# Patient Record
Sex: Female | Born: 1975 | Race: Black or African American | Hispanic: No | Marital: Single | Smoking: Never smoker
Health system: Southern US, Community
[De-identification: ages and names within clinical notes are randomized; demographics above are authoritative.]

## PROBLEM LIST (undated history)

## (undated) DIAGNOSIS — I10 Essential (primary) hypertension: Secondary | ICD-10-CM

## (undated) DIAGNOSIS — D649 Anemia, unspecified: Secondary | ICD-10-CM

## (undated) DIAGNOSIS — K8020 Calculus of gallbladder without cholecystitis without obstruction: Secondary | ICD-10-CM

## (undated) HISTORY — DX: Essential (primary) hypertension: I10

## (undated) HISTORY — DX: Anemia, unspecified: D64.9

## (undated) HISTORY — DX: Calculus of gallbladder without cholecystitis without obstruction: K80.20

## (undated) HISTORY — PX: CHOLECYSTECTOMY: SHX55

---

## 2001-11-17 DIAGNOSIS — I10 Essential (primary) hypertension: Secondary | ICD-10-CM | POA: Insufficient documentation

## 2009-01-27 ENCOUNTER — Emergency Department: Payer: Self-pay | Admitting: Unknown Physician Specialty

## 2009-05-06 ENCOUNTER — Emergency Department: Payer: Self-pay | Admitting: Emergency Medicine

## 2009-05-07 ENCOUNTER — Inpatient Hospital Stay: Payer: Self-pay | Admitting: Obstetrics & Gynecology

## 2009-11-09 HISTORY — PX: ABDOMINAL HYSTERECTOMY: SHX81

## 2010-02-12 ENCOUNTER — Ambulatory Visit: Payer: Self-pay | Admitting: Obstetrics and Gynecology

## 2010-02-20 ENCOUNTER — Ambulatory Visit: Payer: Self-pay | Admitting: Obstetrics and Gynecology

## 2014-01-11 ENCOUNTER — Telehealth (HOSPITAL_COMMUNITY): Payer: Self-pay | Admitting: *Deleted

## 2014-06-01 DIAGNOSIS — R9431 Abnormal electrocardiogram [ECG] [EKG]: Secondary | ICD-10-CM | POA: Insufficient documentation

## 2014-06-01 DIAGNOSIS — R079 Chest pain, unspecified: Secondary | ICD-10-CM | POA: Insufficient documentation

## 2015-04-25 DIAGNOSIS — E559 Vitamin D deficiency, unspecified: Secondary | ICD-10-CM | POA: Insufficient documentation

## 2015-04-25 DIAGNOSIS — R7303 Prediabetes: Secondary | ICD-10-CM | POA: Insufficient documentation

## 2015-04-25 DIAGNOSIS — K2970 Gastritis, unspecified, without bleeding: Secondary | ICD-10-CM

## 2015-04-25 DIAGNOSIS — R42 Dizziness and giddiness: Secondary | ICD-10-CM | POA: Insufficient documentation

## 2015-04-25 DIAGNOSIS — R609 Edema, unspecified: Secondary | ICD-10-CM | POA: Insufficient documentation

## 2015-04-25 DIAGNOSIS — E876 Hypokalemia: Secondary | ICD-10-CM | POA: Insufficient documentation

## 2015-04-25 DIAGNOSIS — B9681 Helicobacter pylori [H. pylori] as the cause of diseases classified elsewhere: Secondary | ICD-10-CM | POA: Insufficient documentation

## 2015-06-24 ENCOUNTER — Telehealth: Payer: Self-pay | Admitting: Physician Assistant

## 2015-06-24 DIAGNOSIS — E559 Vitamin D deficiency, unspecified: Secondary | ICD-10-CM

## 2015-06-24 DIAGNOSIS — I10 Essential (primary) hypertension: Secondary | ICD-10-CM

## 2015-06-24 NOTE — Telephone Encounter (Signed)
Pt contacted office for refill request on the following medications:  metoprolol succinate (TOPROL XL) 100 MG 24 hr and Vitamin D, Ergocalciferol, (DRISDOL) 50000 UNITS.  [facility] [address]  ZO#[phone]

## 2015-06-25 MED ORDER — VITAMIN D (ERGOCALCIFEROL) 1.25 MG (50000 UNIT) PO CAPS: 50000.0000 [IU] | ORAL_CAPSULE | ORAL | Status: DC

## 2015-06-25 MED ORDER — METOPROLOL SUCCINATE ER 100 MG PO TB24: 100.0000 mg | ORAL_TABLET | Freq: Every day | ORAL | Status: DC

## 2015-06-25 NOTE — Telephone Encounter (Signed)
Left patient a message on cell voicemail advising patient that refills have been sent to the pharmacy.

## 2015-06-25 NOTE — Telephone Encounter (Signed)
Please notify meds sent to [facility] [address]  Thanks.

## 2015-07-26 ENCOUNTER — Ambulatory Visit (INDEPENDENT_AMBULATORY_CARE_PROVIDER_SITE_OTHER): Payer: 59 | Admitting: Physician Assistant

## 2015-07-26 ENCOUNTER — Encounter: Payer: Self-pay | Admitting: Physician Assistant

## 2015-07-26 VITALS — BP 132/80 | HR 70 | Temp 98.0°F | Resp 16 | Ht 67.0 in | Wt 233.2 lb

## 2015-07-26 DIAGNOSIS — Z0000 Encounter for general adult medical examination without abnormal findings: Secondary | ICD-10-CM | POA: Diagnosis not present

## 2015-07-26 DIAGNOSIS — Z1239 Encounter for other screening for malignant neoplasm of breast: Secondary | ICD-10-CM | POA: Diagnosis not present

## 2015-07-26 DIAGNOSIS — E782 Mixed hyperlipidemia: Secondary | ICD-10-CM | POA: Insufficient documentation

## 2015-07-26 NOTE — Patient Instructions (Signed)
Health Maintenance Adopting a healthy lifestyle and getting preventive care can go a long way to promote health and wellness. Talk with your health care provider about what schedule of regular examinations is right for you. This is a good chance for you to check in with your provider about disease prevention and staying healthy. In between checkups, there are plenty of things you can do on your own. Experts have done a lot of research about which lifestyle changes and preventive measures are most likely to keep you healthy. Ask your health care provider for more information. WEIGHT AND DIET  Eat a healthy diet 1. Be sure to include plenty of vegetables, fruits, low-fat dairy products, and lean protein. 2. Do not eat a lot of foods high in solid fats, added sugars, or salt. 3. Get regular exercise. This is one of the most important things you can do for your health. 1. Most adults should exercise for at least 150 minutes each week. The exercise should increase your heart rate and make you sweat (moderate-intensity exercise). 2. Most adults should also do strengthening exercises at least twice a week. This is in addition to the moderate-intensity exercise.  Maintain a healthy weight 1. Body mass index (BMI) is a measurement that can be used to identify possible weight problems. It estimates body fat based on height and weight. Your health care provider can help determine your BMI and help you achieve or maintain a healthy weight. 2. For females 6 years of age and older:  1. A BMI below 18.5 is considered underweight. 2. A BMI of 18.5 to 24.9 is normal. 3. A BMI of 25 to 29.9 is considered overweight. 4. A BMI of 30 and above is considered obese.  Watch levels of cholesterol and blood lipids 1. You should start having your blood tested for lipids and cholesterol at 40 years of age, then have this test every 5 years. 2. You may need to have your cholesterol levels checked more often if: 1. Your  lipid or cholesterol levels are high. 2. You are older than 40 years of age. 3. You are at high risk for heart disease.  CANCER SCREENING   Lung Cancer 1. Lung cancer screening is recommended for adults 40-87 years old who are at high risk for lung cancer because of a history of smoking. 2. A yearly low-dose CT scan of the lungs is recommended for people who: 1. Currently smoke. 2. Have quit within the past 15 years. 3. Have at least a 30-pack-year history of smoking. A pack year is smoking an average of one pack of cigarettes a day for 1 year. 3. Yearly screening should continue until it has been 15 years since you quit. 4. Yearly screening should stop if you develop a health problem that would prevent you from having lung cancer treatment.  Breast Cancer  Practice breast self-awareness. This means understanding how your breasts normally appear and feel.  It also means doing regular breast self-exams. Let your health care provider know about any changes, no matter how small.  If you are in your 20s or 30s, you should have a clinical breast exam (CBE) by a health care provider every 1-3 years as part of a regular health exam.  If you are 40 or older, have a CBE every year. Also consider having a breast X-Madlock (mammogram) every year.  If you have a family history of breast cancer, talk to your health care provider about genetic screening.  If you are  at high risk for breast cancer, talk to your health care provider about having an MRI and a mammogram every year.  Breast cancer gene (BRCA) assessment is recommended for women who have family members with BRCA-related cancers. BRCA-related cancers include:  Breast.  Ovarian.  Tubal.  Peritoneal cancers.  Results of the assessment will determine the need for genetic counseling and BRCA1 and BRCA2 testing. Cervical Cancer Routine pelvic examinations to screen for cervical cancer are no longer recommended for nonpregnant women who  are considered low risk for cancer of the pelvic organs (ovaries, uterus, and vagina) and who do not have symptoms. A pelvic examination may be necessary if you have symptoms including those associated with pelvic infections. Ask your health care provider if a screening pelvic exam is right for you.   The Pap test is the screening test for cervical cancer for women who are considered at risk.  If you had a hysterectomy for a problem that was not cancer or a condition that could lead to cancer, then you no longer need Pap tests.  If you are older than 65 years, and you have had normal Pap tests for the past 10 years, you no longer need to have Pap tests.  If you have had past treatment for cervical cancer or a condition that could lead to cancer, you need Pap tests and screening for cancer for at least 20 years after your treatment.  If you no longer get a Pap test, assess your risk factors if they change (such as having a new sexual partner). This can affect whether you should start being screened again.  Some women have medical problems that increase their chance of getting cervical cancer. If this is the case for you, your health care provider may recommend more frequent screening and Pap tests.  The human papillomavirus (HPV) test is another test that may be used for cervical cancer screening. The HPV test looks for the virus that can cause cell changes in the cervix. The cells collected during the Pap test can be tested for HPV.  The HPV test can be used to screen women 2 years of age and older. Getting tested for HPV can extend the interval between normal Pap tests from three to five years.  An HPV test also should be used to screen women of any age who have unclear Pap test results.  After 40 years of age, women should have HPV testing as often as Pap tests.  Colorectal Cancer  This type of cancer can be detected and often prevented.  Routine colorectal cancer screening usually  begins at 40 years of age and continues through 40 years of age.  Your health care provider may recommend screening at an earlier age if you have risk factors for colon cancer.  Your health care provider may also recommend using home test kits to check for hidden blood in the stool.  A small camera at the end of a tube can be used to examine your colon directly (sigmoidoscopy or colonoscopy). This is done to check for the earliest forms of colorectal cancer.  Routine screening usually begins at age 57.  Direct examination of the colon should be repeated every 5-10 years through 40 years of age. However, you may need to be screened more often if early forms of precancerous polyps or small growths are found. Skin Cancer  Check your skin from head to toe regularly.  Tell your health care provider about any new moles or changes in  moles, especially if there is a change in a mole's shape or color.  Also tell your health care provider if you have a mole that is larger than the size of a pencil eraser.  Always use sunscreen. Apply sunscreen liberally and repeatedly throughout the day.  Protect yourself by wearing long sleeves, pants, a wide-brimmed hat, and sunglasses whenever you are outside. HEART DISEASE, DIABETES, AND HIGH BLOOD PRESSURE   Have your blood pressure checked at least every 1-2 years. High blood pressure causes heart disease and increases the risk of stroke.  If you are between 32 years and 30 years old, ask your health care provider if you should take aspirin to prevent strokes.  Have regular diabetes screenings. This involves taking a blood sample to check your fasting blood sugar level.  If you are at a normal weight and have a low risk for diabetes, have this test once every three years after 40 years of age.  If you are overweight and have a high risk for diabetes, consider being tested at a younger age or more often. PREVENTING INFECTION  Hepatitis B  If you have a  higher risk for hepatitis B, you should be screened for this virus. You are considered at high risk for hepatitis B if:  You were born in a country where hepatitis B is common. Ask your health care provider which countries are considered high risk.  Your parents were born in a high-risk country, and you have not been immunized against hepatitis B (hepatitis B vaccine).  You have HIV or AIDS.  You use needles to inject street drugs.  You live with someone who has hepatitis B.  You have had sex with someone who has hepatitis B.  You get hemodialysis treatment.  You take certain medicines for conditions, including cancer, organ transplantation, and autoimmune conditions. Hepatitis C  Blood testing is recommended for:  Everyone born from [birth year] through 1966.  Anyone with known risk factors for hepatitis C. Sexually transmitted infections (STIs)  You should be screened for sexually transmitted infections (STIs) including gonorrhea and chlamydia if:  You are sexually active and are younger than 40 years of age.  You are older than 40 years of age and your health care provider tells you that you are at risk for this type of infection.  Your sexual activity has changed since you were last screened and you are at an increased risk for chlamydia or gonorrhea. Ask your health care provider if you are at risk.  If you do not have HIV, but are at risk, it may be recommended that you take a prescription medicine daily to prevent HIV infection. This is called pre-exposure prophylaxis (PrEP). You are considered at risk if:  You are sexually active and do not regularly use condoms or know the HIV status of your partner(s).  You take drugs by injection.  You are sexually active with a partner who has HIV. Talk with your health care provider about whether you are at high risk of being infected with HIV. If you choose to begin PrEP, you should first be tested for HIV. You should then be tested  every 3 months for as long as you are taking PrEP.  PREGNANCY   If you are premenopausal and you may become pregnant, ask your health care provider about preconception counseling.  If you may become pregnant, take 400 to 800 micrograms (mcg) of folic acid every day.  If you want to prevent pregnancy, talk to your  health care provider about birth control (contraception). OSTEOPOROSIS AND MENOPAUSE   Osteoporosis is a disease in which the bones lose minerals and strength with aging. This can result in serious bone fractures. Your risk for osteoporosis can be identified using a bone density scan.  If you are 34 years of age or older, or if you are at risk for osteoporosis and fractures, ask your health care provider if you should be screened.  Ask your health care provider whether you should take a calcium or vitamin D supplement to lower your risk for osteoporosis.  Menopause may have certain physical symptoms and risks.  Hormone replacement therapy may reduce some of these symptoms and risks. Talk to your health care provider about whether hormone replacement therapy is right for you.  HOME CARE INSTRUCTIONS   Schedule regular health, dental, and eye exams.  Stay current with your immunizations.   Do not use any tobacco products including cigarettes, chewing tobacco, or electronic cigarettes.  If you are pregnant, do not drink alcohol.  If you are breastfeeding, limit how much and how often you drink alcohol.  Limit alcohol intake to no more than 1 drink per day for nonpregnant women. One drink equals 12 ounces of beer, 5 ounces of wine, or 1 ounces of hard liquor.  Do not use street drugs.  Do not share needles.  Ask your health care provider for help if you need support or information about quitting drugs.  Tell your health care provider if you often feel depressed.  Tell your health care provider if you have ever been abused or do not feel safe at home. Document  Released: 05/11/2011 Document Revised: 03/12/2014 Document Reviewed: 09/27/2013 [place] [facility] Patient Information 2015 [place], [place]. This information is not intended to replace advice given to you by your health care provider. Make sure you discuss any questions you have with your health care provider.     Why follow it? Research shows. . Those who follow the Mediterranean diet have a reduced risk of heart disease  . The diet is associated with a reduced incidence of Parkinson's and Alzheimer's diseases . People following the diet may have longer life expectancies and lower rates of chronic diseases  . The Dietary Guidelines for Americans recommends the Mediterranean diet as an eating plan to promote health and prevent disease  What Is the Mediterranean Diet?  . Healthy eating plan based on typical foods and recipes of Mediterranean-style cooking . The diet is primarily a plant based diet; these foods should make up a majority of meals   Starches - Plant based foods should make up a majority of meals - They are an important sources of vitamins, minerals, energy, antioxidants, and fiber - Choose whole grains, foods high in fiber and minimally processed items  - Typical grain sources include wheat, oats, barley, corn, brown rice, bulgar, farro, millet, polenta, couscous  - Various types of beans include chickpeas, lentils, fava beans, black beans, white beans   Fruits  Veggies - Large quantities of antioxidant rich fruits & veggies; 6 or more servings  - Vegetables can be eaten raw or lightly drizzled with oil and cooked  - Vegetables common to the traditional Mediterranean Diet include: artichokes, arugula, beets, broccoli, brussel sprouts, cabbage, carrots, celery, collard greens, cucumbers, eggplant, kale, leeks, lemons, lettuce, mushrooms, okra, onions, peas, peppers, potatoes, pumpkin, radishes, rutabaga, shallots, spinach, sweet potatoes, turnips, zucchini - Fruits common to the Mediterranean  Diet include: apples, apricots, avocados, cherries, clementines, dates, figs, grapefruits,  grapes, melons, nectarines, oranges, peaches, pears, pomegranates, strawberries, tangerines  Fats - Replace butter and margarine with healthy oils, such as olive oil, canola oil, and tahini  - Limit nuts to no more than a handful a day  - Nuts include walnuts, almonds, pecans, pistachios, pine nuts  - Limit or avoid candied, honey roasted or heavily salted nuts - Olives are central to the Mediterranean diet - can be eaten whole or used in a variety of dishes   Meats Protein - Limiting red meat: no more than a few times a month - When eating red meat: choose lean cuts and keep the portion to the size of deck of cards - Eggs: approx. 0 to 4 times a week  - Fish and lean poultry: at least 2 a week  - Healthy protein sources include, chicken, [place], lean beef, lamb - Increase intake of seafood such as tuna, salmon, trout, mackerel, shrimp, scallops - Avoid or limit high fat processed meats such as sausage and bacon  Dairy - Include moderate amounts of low fat dairy products  - Focus on healthy dairy such as fat free yogurt, skim milk, low or reduced fat cheese - Limit dairy products higher in fat such as whole or 2% milk, cheese, ice cream  Alcohol - Moderate amounts of red wine is ok  - No more than 5 oz daily for women (all ages) and men older than age 74  - No more than 10 oz of wine daily for men younger than 44  Other - Limit sweets and other desserts  - Use herbs and spices instead of salt to flavor foods  - Herbs and spices common to the traditional Mediterranean Diet include: basil, bay leaves, chives, cloves, cumin, fennel, garlic, lavender, marjoram, mint, oregano, parsley, pepper, rosemary, sage, savory, sumac, tarragon, thyme   It's not just a diet, it's a lifestyle:  . The Mediterranean diet includes lifestyle factors typical of those in the region  . Foods, drinks and meals are best eaten  with others and savored . Daily physical activity is important for overall good health . This could be strenuous exercise like running and aerobics . This could also be more leisurely activities such as walking, housework, yard-work, or taking the stairs . Moderation is the key; a balanced and healthy diet accommodates most foods and drinks . Consider portion sizes and frequency of consumption of certain foods   Meal Ideas & Options:  . Breakfast:  o Whole wheat toast or whole wheat English muffins with peanut butter & hard boiled egg o Steel cut oats topped with apples & cinnamon and skim milk  o Fresh fruit: banana, strawberries, melon, berries, peaches  o Smoothies: strawberries, bananas, greek yogurt, peanut butter o Low fat greek yogurt with blueberries and granola  o Egg white omelet with spinach and mushrooms o Breakfast couscous: whole wheat couscous, apricots, skim milk, cranberries  . Sandwiches:  o Hummus and grilled vegetables (peppers, zucchini, squash) on whole wheat bread   o Grilled chicken on whole wheat pita with lettuce, tomatoes, cucumbers or tzatziki  o Tuna salad on whole wheat bread: tuna salad made with greek yogurt, olives, red peppers, capers, green onions o Garlic rosemary lamb pita: lamb sauted with garlic, rosemary, salt & pepper; add lettuce, cucumber, greek yogurt to pita - flavor with lemon juice and black pepper  . Seafood:  o Mediterranean grilled salmon, seasoned with garlic, basil, parsley, lemon juice and black pepper o Shrimp, lemon, and spinach  whole-grain pasta salad made with low fat greek yogurt  o Seared scallops with lemon orzo  o Seared tuna steaks seasoned salt, pepper, coriander topped with tomato mixture of olives, tomatoes, olive oil, minced garlic, parsley, green onions and cappers  . Meats:  o Herbed greek chicken salad with kalamata olives, cucumber, feta  o Red bell peppers stuffed with spinach, bulgur, lean ground beef (or lentils) &  topped with feta   o Kebabs: skewers of chicken, tomatoes, onions, zucchini, squash  o [place] burgers: made with red onions, mint, dill, lemon juice, feta cheese topped with roasted red peppers . Vegetarian o Cucumber salad: cucumbers, artichoke hearts, celery, red onion, feta cheese, tossed in olive oil & lemon juice  o Hummus and whole grain pita points with a greek salad (lettuce, tomato, feta, olives, cucumbers, red onion) o Lentil soup with celery, carrots made with vegetable broth, garlic, salt and pepper  o Tabouli salad: parsley, bulgur, mint, scallions, cucumbers, tomato, radishes, lemon juice, olive oil, salt and pepper.      American Heart Association (AHA) Exercise Recommendation  Being physically active is important to prevent heart disease and stroke, the nation's No. 1and No. 5killers. To improve overall cardiovascular health, we suggest at least 150 minutes per week of moderate exercise or 75 minutes per week of vigorous exercise (or a combination of moderate and vigorous activity). Thirty minutes a day, five times a week is an easy goal to remember. You will also experience benefits even if you divide your time into two or three segments of 10 to 15 minutes per day.  For people who would benefit from lowering their blood pressure or cholesterol, we recommend 40 minutes of aerobic exercise of moderate to vigorous intensity three to four times a week to lower the risk for heart attack and stroke.  Physical activity is anything that makes you move your body and burn calories.  This includes things like climbing stairs or playing sports. Aerobic exercises benefit your heart, and include walking, jogging, swimming or biking. Strength and stretching exercises are best for overall stamina and flexibility.  The simplest, positive change you can make to effectively improve your heart health is to start walking. It's enjoyable, free, easy, social and great exercise. A walking program is  flexible and boasts high success rates because people can stick with it. It's easy for walking to become a regular and satisfying part of life.   For Overall Cardiovascular Health:  At least 30 minutes of moderate-intensity aerobic activity at least 5 days per week for a total of 150  OR   At least 25 minutes of vigorous aerobic activity at least 3 days per week for a total of 75 minutes; or a combination of moderate- and vigorous-intensity aerobic activity  AND   Moderate- to high-intensity muscle-strengthening activity at least 2 days per week for additional health benefits.  For Lowering Blood Pressure and Cholesterol  An average 40 minutes of moderate- to vigorous-intensity aerobic activity 3 or 4 times per week  What if I can't make it to the time goal? Something is always better than nothing! And everyone has to start somewhere. Even if you've been sedentary for years, today is the day you can begin to make healthy changes in your life. If you don't think you'll make it for 30 or 40 minutes, set a reachable goal for today. You can work up toward your overall goal by increasing your time as you get stronger. Don't let all-or-nothing thinking  rob you of doing what you can every day.  Source:http://www.heart.org

## 2015-07-26 NOTE — Progress Notes (Signed)
Patient: Amanda Ramsey, Female    DOB: 08-11-1976, 39 y.o.   MRN: [id number] Visit Date: 07/26/2015  Today's Vickee Mormino: Amanda Loveless, Amanda Ramsey   Chief Complaint  Patient presents with  . Annual Exam   Subjective:    Annual physical exam Amanda Ramsey is a 39 y.o. female who presents today for health maintenance and complete physical. She feels well. She reports not exercising . She reports she is sleeping fairly well, depends on the situation. Last Pap smear was in July 2015 and was normal. She has had a hysterectomy secondary to uterine fibroids and endometriosis. Ovaries and cervix remained. There is no family history or personal history of cervical or ovarian cancer. She does perform monthly breast exams. She has never had a mammogram. There is no family history of breast cancer. She has never had a colonoscopy. There had been no bowel changes. She also denies melena or hematochezia. There is no family history of colon cancer. She would like to be checked today for sexually transmitted diseases.   Last PCP:05/25/14 Pap Smear: Normal, HPV, GC, Chlamydia Negative. 05/25/14 Last Labs 04/09/15  -----------------------------------------------------------------   Review of Systems  Constitutional: Negative.   HENT: Negative.   Eyes: Negative.   Respiratory: Negative.   Cardiovascular: Negative.   Gastrointestinal: Negative.   Endocrine: Negative.   Genitourinary: Negative.   Musculoskeletal: Negative.   Skin: Negative.   Allergic/Immunologic: Negative.   Neurological: Negative.   Hematological: Negative.   Psychiatric/Behavioral: Negative.     Social History She  reports that she has never smoked. She does not have any smokeless tobacco history on file. She reports that she drinks alcohol. She reports that she does not use illicit drugs. Social History   Social History  . Marital Status: Single    Spouse Name: N/A  . Number of Children: N/A  . Years of  Education: N/A   Social History Main Topics  . Smoking status: Never Smoker   . Smokeless tobacco: None  . Alcohol Use: 0.0 oz/week    0 Standard drinks or equivalent per week     Comment: occasionally  . Drug Use: No  . Sexual Activity: Not Asked   Other Topics Concern  . None   Social History Narrative    Patient Active Problem List   Diagnosis Date Noted  . Combined fat and carbohydrate induced hyperlipemia 07/26/2015  . Dizziness and giddiness 04/25/2015  . Accumulation of fluid in tissues 04/25/2015  . Gastritis, Helicobacter pylori 04/25/2015  . Decreased potassium in the blood 04/25/2015  . Borderline diabetes 04/25/2015  . Avitaminosis D 04/25/2015  . Abnormal ECG 06/01/2014  . Chest pain 06/01/2014  . Irregular bleeding 03/18/2009  . Adiposity 09/28/2003  . Essential (primary) hypertension 11/17/2001  . Hypercholesterolemia without hypertriglyceridemia 11/29/2000  . Family history of cardiovascular disease 11/09/1998    Past Surgical History  Procedure Laterality Date  . Abdominal hysterectomy  2011  . Cholecystectomy      Family History  Family Status  Relation Status Death Age  . Mother Alive   . Father Deceased   . Sister Alive    Her family history includes Alcohol abuse in her father; Cirrhosis in her father; Hypertension in her mother and sister; Seizures in her father.    No Known Allergies  Previous Medications   METOPROLOL SUCCINATE (TOPROL XL) 100 MG 24 HR TABLET    Take 1 tablet (100 mg total) by mouth daily.   TRIAMTERENE-HYDROCHLOROTHIAZIDE (  MAXZIDE) 75-50 MG PER TABLET    Take 1 tablet by mouth daily.   VITAMIN D, ERGOCALCIFEROL, (DRISDOL) 50000 UNITS CAPS CAPSULE    Take 1 capsule (50,000 Units total) by mouth every 30 (thirty) days.    Patient Care Team: Amanda Loveless, Amanda Ramsey as PCP - General (Physician Assistant)     Objective:   Vitals: BP 132/80 mmHg  Pulse 70  Temp(Src) 98 F (36.7 C) (Oral)  Resp 16  Ht   (1.702 m)  Wt 233 lb 3.2 oz (105.779 kg)  BMI 36.52 kg/m2   Physical Exam  Constitutional: She is oriented to person, place, and time. She appears well-developed and well-nourished. No distress.  HENT:  Head: Normocephalic and atraumatic.  Right Ear: External ear normal.  Left Ear: External ear normal.  Nose: Nose normal.  Mouth/Throat: Oropharynx is clear and moist. No oropharyngeal exudate.  Eyes: Conjunctivae and EOM are normal. Pupils are equal, round, and reactive to light. Right eye exhibits no discharge. Left eye exhibits no discharge. No scleral icterus.  Neck: Normal range of motion. Neck supple. No JVD present. No tracheal deviation present. No thyromegaly present.  Cardiovascular: Normal rate, regular rhythm, normal heart sounds and intact distal pulses.  Exam reveals no gallop and no friction rub.   No murmur heard. Pulmonary/Chest: Effort normal and breath sounds normal. No respiratory distress. She has no wheezes. She has no rales. She exhibits no tenderness. Right breast exhibits no inverted nipple, no mass, no nipple discharge, no skin change and no tenderness. Left breast exhibits no inverted nipple, no mass, no nipple discharge, no skin change and no tenderness. Breasts are symmetrical.  Abdominal: Soft. Bowel sounds are normal. She exhibits no distension and no mass. There is no tenderness. There is no rebound and no guarding.  Musculoskeletal: Normal range of motion. She exhibits no edema or tenderness.  Lymphadenopathy:    She has no cervical adenopathy.  Neurological: She is alert and oriented to person, place, and time.  Skin: Skin is warm and dry. No rash noted. She is not diaphoretic.  Psychiatric: She has a normal mood and affect. Her behavior is normal. Judgment and thought content normal.  Vitals reviewed.    Depression Screen No flowsheet data found.    Assessment & Plan:     Routine Health Maintenance and Physical Exam  Exercise Activities and  Dietary recommendations Goals    None      Immunization History  Administered Date(s) Administered  . Td 03/18/2005  . Tdap 09/24/2011    Health Maintenance  Topic Date Due  . HIV Screening  07/08/1990  . PAP SMEAR  07/08/1996  . INFLUENZA VACCINE  06/10/2015  . TETANUS/TDAP  09/23/2021      Discussed health benefits of physical activity, and encouraged her to engage in regular exercise appropriate for her age and condition.  1. Annual physical exam Physical exam today was normal. I will will order labs for STD testing as below. I will see her back in 3 months to recheck her hemoglobin A1c, cholesterol and vitamin D levels. She is to call the office if she has any issues arise in the meantime. - Mammogram Digital Screening; Future - HIV antibody (with reflex) - RPR - Hepatitis C Antibody  2. Breast cancer screening Order for screening breast mammogram ordered. The phone number to normal breast clinic was given to patient so that she may call and schedule her appointment for the mammogram. - Mammogram Digital Screening; Future   --------------------------------------------------------------------

## 2015-07-27 LAB — RPR: RPR Ser Ql: NONREACTIVE

## 2015-07-27 LAB — HIV ANTIBODY (ROUTINE TESTING W REFLEX): HIV SCREEN 4TH GENERATION: NONREACTIVE

## 2015-07-27 LAB — HEPATITIS C ANTIBODY: Hep C Virus Ab: <0.1 s/co ratio (ref 0.0–0.9)

## 2015-07-29 ENCOUNTER — Telehealth: Payer: Self-pay

## 2015-07-29 NOTE — Telephone Encounter (Signed)
-----   Message from Margaretann Loveless, PA-C sent at 07/29/2015  9:39 AM EDT ----- Screening labs were all negative.

## 2015-07-29 NOTE — Telephone Encounter (Signed)
Patient advised as directed below.  Thanks,  -Joseline 

## 2015-09-09 ENCOUNTER — Telehealth: Payer: Self-pay | Admitting: Physician Assistant

## 2015-09-09 NOTE — Telephone Encounter (Signed)
Patient advised as below.  Thanks,  -Charae Depaolis 

## 2015-09-09 NOTE — Telephone Encounter (Signed)
Pt called wanting to know if she has ever had a tet .dt vaccine.  Her call back is [phone].  She said you could leave her a message either way if you do not get her.  Thanks Barth Kirkseri

## 2015-09-09 NOTE — Telephone Encounter (Signed)
Called patient @# given below per person that answered stated it was the wrong number.  Called home number and LM for patient to call back.   Patient got Td: 03/18/2005 Tdap: 09/24/2011  Thanks,  -Joseline

## 2015-09-12 ENCOUNTER — Telehealth: Payer: Self-pay | Admitting: Family Medicine

## 2015-09-12 DIAGNOSIS — Z111 Encounter for screening for respiratory tuberculosis: Secondary | ICD-10-CM

## 2015-09-12 NOTE — Telephone Encounter (Signed)
Ok to order. Thanks.   

## 2015-09-12 NOTE — Telephone Encounter (Signed)
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 Thanks TNP

## 2015-09-12 NOTE — Telephone Encounter (Signed)
Ordered test. Lab slip printed. Left message saying that lab slip was ready.

## 2015-09-12 NOTE — Telephone Encounter (Signed)
Ok to order quantiferon TB gold test? Please advise. Thanks!

## 2015-09-17 ENCOUNTER — Telehealth: Payer: Self-pay | Admitting: Physician Assistant

## 2015-09-17 NOTE — Telephone Encounter (Signed)
Pt called wanting her lab results from last week.  Her call back is [phone] she said you could leave a message,  Thanks teri

## 2015-09-18 ENCOUNTER — Telehealth: Payer: Self-pay

## 2015-09-18 LAB — QUANTIFERON IN TUBE
QFT TB AG MINUS NIL VALUE: 0.08 IU/mL
QUANTIFERON NIL VALUE: 0.06 [IU]/mL
QUANTIFERON TB AG VALUE: 0.14 IU/mL
QUANTIFERON TB GOLD: NEGATIVE

## 2015-09-18 LAB — QUANTIFERON TB GOLD ASSAY (BLOOD)

## 2015-09-18 NOTE — Telephone Encounter (Signed)
Yes that will be fine. 

## 2015-09-18 NOTE — Telephone Encounter (Signed)
Advised pt of lab results. Pt verbally acknowledges understanding. Emily Drozdowski, CMA   

## 2015-09-18 NOTE — Telephone Encounter (Signed)
I see the results. I just need you to ok me to give results.  Thanks,  -Ji Feldner

## 2015-09-18 NOTE — Telephone Encounter (Signed)
LMTCB  Thanks,  -Zakary Kimura 

## 2015-09-18 NOTE — Telephone Encounter (Signed)
-----   Message from Lorie PhenixNancy Maloney, MD sent at 09/18/2015  1:17 PM EST ----- TB negative. Please notify patient. Thanks.

## 2015-09-19 NOTE — Telephone Encounter (Signed)
LMTCB  Thanks,  -Joseline 

## 2015-09-20 NOTE — Telephone Encounter (Signed)
Left detailed message.(per previous note per patient ok to LM)  Thanks,  -Joseline

## 2015-10-25 ENCOUNTER — Ambulatory Visit: Payer: 59 | Admitting: Physician Assistant

## 2015-11-14 ENCOUNTER — Encounter: Payer: Self-pay | Admitting: Physician Assistant

## 2015-11-14 ENCOUNTER — Ambulatory Visit (INDEPENDENT_AMBULATORY_CARE_PROVIDER_SITE_OTHER): Payer: 59 | Admitting: Physician Assistant

## 2015-11-14 VITALS — BP 122/80 | HR 69 | Temp 98.8°F | Resp 16 | Wt 235.8 lb

## 2015-11-14 DIAGNOSIS — E669 Obesity, unspecified: Secondary | ICD-10-CM | POA: Diagnosis not present

## 2015-11-14 DIAGNOSIS — R7303 Prediabetes: Secondary | ICD-10-CM

## 2015-11-14 DIAGNOSIS — E7800 Pure hypercholesterolemia, unspecified: Secondary | ICD-10-CM | POA: Diagnosis not present

## 2015-11-14 DIAGNOSIS — Z6836 Body mass index (BMI) 36.0-36.9, adult: Secondary | ICD-10-CM | POA: Diagnosis not present

## 2015-11-14 DIAGNOSIS — I10 Essential (primary) hypertension: Secondary | ICD-10-CM | POA: Diagnosis not present

## 2015-11-14 DIAGNOSIS — Z713 Dietary counseling and surveillance: Secondary | ICD-10-CM | POA: Diagnosis not present

## 2015-11-14 MED ORDER — PHENTERMINE HCL 37.5 MG PO TABS
37.5000 mg | ORAL_TABLET | Freq: Every day | ORAL | Status: DC
Start: 2015-11-14 — End: 2015-11-25

## 2015-11-14 MED ORDER — TRIAMTERENE-HCTZ 75-50 MG PO TABS: 1.0000 | ORAL_TABLET | Freq: Every day | ORAL | Status: DC

## 2015-11-14 NOTE — Progress Notes (Signed)
Patient: Amanda RaringRasheeda A Ramsey Female    DOB: [birth year]   40 y.o.   MRN: [id number] Visit Date: 11/14/2015  Today's Provider: Margaretann LovelessJennifer M Burnette, PA-C   Chief Complaint  Patient presents with  . Follow-up    HTN   Subjective:    HPI  Hypertension, follow-up:  BP Readings from Last 3 Encounters:  11/14/15 122/80  07/26/15 132/80  04/01/15 142/88    She was last seen for hypertension 3 months ago.  BP at that visit was 132/80. Management since that visit includes none. Blood pressure has been stable. She reports excellent compliance with treatment. She is not having side effects.  She is not exercising. She is not adherent to low salt diet.  . She is experiencing none.    Weight trend: stable Wt Readings from Last 3 Encounters:  11/14/15 235 lb 12.8 oz (106.958 kg)  07/26/15 233 lb 3.2 oz (105.779 kg)  04/01/15 235 lb (106.595 kg)    Current diet: in general, a "healthy" diet    Obesity: Patient complains of obesity. Patient cites health, increased physical ability, self-image as reasons for wanting to lose weight.  Obesity History Weight in late teens: 120 lb. Period of greatest weight gain: 235.8 lbs now. Highest adult weight: 235 lbs   History of Weight Loss Efforts Greatest amount of weight lost:8 lb over 6 months Circumstances associated with regain of weight: sometimes feeling sad. Successful weight loss techniques attempted: nutritionist consultation Unsuccessful weight loss techniques attempted: nutritionist consultation  Current Exercise Habits none  Current Eating Habits Number of regular meals per day: 2 Number of snacking episodes per day: snack all day Who shops for food? patient Who prepares food? patient Who eats with patient? patient Binge behavior?: yes - sometimes. Purge behavior? no Anorexic behavior? no Eating precipitated by stress? yes -  Guilt feelings associated with eating? yes -   Other Potential Contributing  Factors Use of alcohol: average 2 amonth History of past abuse? none Psych History: none   ------------------------------------------------------------------------      No Known Allergies Previous Medications   METOPROLOL SUCCINATE (TOPROL XL) 100 MG 24 HR TABLET    Take 1 tablet (100 mg total) by mouth daily.   TRIAMTERENE-HYDROCHLOROTHIAZIDE (MAXZIDE) 75-50 MG PER TABLET    Take 1 tablet by mouth daily.   VITAMIN D, ERGOCALCIFEROL, (DRISDOL) 50000 UNITS CAPS CAPSULE    Take 1 capsule (50,000 Units total) by mouth every 30 (thirty) days.    Review of Systems  Constitutional: Negative.   Eyes: Negative.   Respiratory: Negative.   Cardiovascular: Negative for chest pain, palpitations and leg swelling.  Gastrointestinal: Negative.   Musculoskeletal: Negative.   Neurological: Positive for headaches. Negative for dizziness.    Social History  Substance Use Topics  . Smoking status: Never Smoker   . Smokeless tobacco: Not on file  . Alcohol Use: 0.0 oz/week    0 Standard drinks or equivalent per week     Comment: occasionally   Objective:   BP 122/80 mmHg  Pulse 69  Temp(Src) 98.8 F (37.1 C) (Oral)  Resp 16  Wt 235 lb 12.8 oz (106.958 kg)  Physical Exam  Constitutional: She appears well-developed and well-nourished. No distress.  Neck: Normal range of motion. Neck supple. No tracheal deviation present. No thyromegaly present.  Cardiovascular: Normal rate, regular rhythm and normal heart sounds.  Exam reveals no gallop and no friction rub.   No murmur heard. Pulmonary/Chest: Effort normal and breath sounds  normal. No respiratory distress. She has no wheezes. She has no rales.  Lymphadenopathy:    She has no cervical adenopathy.  Skin: She is not diaphoretic.  Vitals reviewed.       Assessment & Plan:     1. Essential (primary) hypertension Currently stable with Maxide and metoprolol. We'll continue current medical treatment. I will check labs as below. I will  follow-up with her pending these lab results. I will follow-up with her in 6 months to evaluate how she is doing with treatment. She is to call the office if she has any worsening symptoms in the meantime. - CBC with Differential/Platelet - TSH - triamterene-hydrochlorothiazide (MAXZIDE) 75-50 MG tablet; Take 1 tablet by mouth daily.  Dispense: 30 tablet; Refill: 11  2. Borderline diabetes  I will check hemoglobin A1c as below. I will follow-up with her pending results. We did discuss diet and exercise which may help lower her blood sugar as well. She is to call the office if she has any worsening symptoms. - Hemoglobin A1c  3. Hypercholesterolemia without hypertriglyceridemia  I will check cholesterol as below. She is currently not on any medication for cholesterol but does have a history of elevated cholesterol levels. I will follow-up with her pending these lab results. - Lipid panel  4. Encounter for weight loss counseling We discussed in detail weight loss options including food diary and exercise. I did advise her that adhering to a limited calorie diet between 1215 100 cal would be appropriate for her. I also advised for her to start with physical activity slowly going for 20-30 minutes 3-4 days a week and increasing gradually as she continues to improve. I will also give her a prescription for phentermine as below for appetite suppression to help with motivation with her lifestyle changes. I will see her back in 4 weeks to evaluate how she is doing and to continue educating her on continued weight loss options. She is to call the office in the meantime if she has any adverse reaction to the medication, acute issues, questions or concerns. - phentermine (ADIPEX-P) 37.5 MG tablet; Take 1 tablet (37.5 mg total) by mouth daily before breakfast.  Dispense: 30 tablet; Refill: 0  5. Obesity See above medical treatment plan.  6. BMI 36.0-36.9,adult See above medical treatment plan. -  phentermine (ADIPEX-P) 37.5 MG tablet; Take 1 tablet (37.5 mg total) by mouth daily before breakfast.  Dispense: 30 tablet; Refill: 0   I did spend approximately 40 minutes with the patient with greater than 50% of that time on counseling and educating the patient on weight loss and dieting.       Margaretann Loveless, PA-C  [facility]

## 2015-11-14 NOTE — Patient Instructions (Addendum)
Exercising to Lose Weight Exercising can help you to lose weight. In order to lose weight through exercise, you need to do vigorous-intensity exercise. You can tell that you are exercising with vigorous intensity if you are breathing very hard and fast and cannot hold a conversation while exercising. Moderate-intensity exercise helps to maintain your current weight. You can tell that you are exercising at a moderate level if you have a higher heart rate and faster breathing, but you are still able to hold a conversation. HOW OFTEN SHOULD I EXERCISE? Choose an activity that you enjoy and set realistic goals. Your health care provider can help you to make an activity plan that works for you. Exercise regularly as directed by your health care provider. This may include:  Doing resistance training twice each week, such as:  Push-ups.  Sit-ups.  Lifting weights.  Using resistance bands.  Doing a given intensity of exercise for a given amount of time. Choose from these options:  150 minutes of moderate-intensity exercise every week.  75 minutes of vigorous-intensity exercise every week.  A mix of moderate-intensity and vigorous-intensity exercise every week. Children, pregnant women, people who are out of shape, people who are overweight, and older adults may need to consult a health care provider for individual recommendations. If you have any sort of medical condition, be sure to consult your health care provider before starting a new exercise program. WHAT ARE SOME ACTIVITIES THAT CAN HELP ME TO LOSE WEIGHT?   Walking at a rate of at least 4.5 miles an hour.  Jogging or running at a rate of 5 miles per hour.  Biking at a rate of at least 10 miles per hour.  Lap swimming.  Roller-skating or in-line skating.  Cross-country skiing.  Vigorous competitive sports, such as football, basketball, and soccer.  Jumping rope.  Aerobic dancing. HOW CAN I BE MORE ACTIVE IN MY DAY-TO-DAY  ACTIVITIES?  Use the stairs instead of the elevator.  Take a walk during your lunch break.  If you drive, park your car farther away from work or school.  If you take public transportation, get off one stop early and walk the rest of the way.  Make all of your phone calls while standing up and walking around.  Get up, stretch, and walk around every 30 minutes throughout the day. WHAT GUIDELINES SHOULD I FOLLOW WHILE EXERCISING?  Do not exercise so much that you hurt yourself, feel dizzy, or get very short of breath.  Consult your health care provider prior to starting a new exercise program.  Wear comfortable clothes and shoes with good support.  Drink plenty of water while you exercise to prevent dehydration or heat stroke. Body water is lost during exercise and must be replaced.  Work out until you breathe faster and your heart beats faster.   This information is not intended to replace advice given to you by your health care provider. Make sure you discuss any questions you have with your health care provider.   Document Released: 11/28/2010 Document Revised: 11/16/2014 Document Reviewed: 03/29/2014 Elsevier Interactive Patient Education 2016 [employer]   Calorie Counting for Weight Loss Calories are energy you get from the things you eat and drink. Your body uses this energy to keep you going throughout the day. The number of calories you eat affects your weight. When you eat more calories than your body needs, your body stores the extra calories as fat. When you eat fewer calories than your body needs, your   body burns fat to get the energy it needs. Calorie counting means keeping track of how many calories you eat and drink each day. If you make sure to eat fewer calories than your body needs, you should lose weight. In order for calorie counting to work, you will need to eat the number of calories that are right for you in a day to lose a healthy amount of weight per week.  A healthy amount of weight to lose per week is usually 1-2 lb (0.5-0.9 kg). A dietitian can determine how many calories you need in a day and give you suggestions on how to reach your calorie goal.  WHAT IS MY MY PLAN? My goal is to have 1200-1500 calories per day.  If I have this many calories per day, I should lose around 1-2 pounds per week. WHAT DO I NEED TO KNOW ABOUT CALORIE COUNTING? In order to meet your daily calorie goal, you will need to:  Find out how many calories are in each food you would like to eat. Try to do this before you eat.  Decide how much of the food you can eat.  Write down what you ate and how many calories it had. Doing this is called keeping a food log. WHERE DO I FIND CALORIE INFORMATION? The number of calories in a food can be found on a Nutrition Facts label. Note that all the information on a label is based on a specific serving of the food. If a food does not have a Nutrition Facts label, try to look up the calories online or ask your dietitian for help. HOW DO I DECIDE HOW MUCH TO EAT? To decide how much of the food you can eat, you will need to consider both the number of calories in one serving and the size of one serving. This information can be found on the Nutrition Facts label. If a food does not have a Nutrition Facts label, look up the information online or ask your dietitian for help. Remember that calories are listed per serving. If you choose to have more than one serving of a food, you will have to multiply the calories per serving by the amount of servings you plan to eat. For example, the label on a package of bread might say that a serving size is 1 slice and that there are 90 calories in a serving. If you eat 1 slice, you will have eaten 90 calories. If you eat 2 slices, you will have eaten 180 calories. HOW DO I KEEP A FOOD LOG? After each meal, record the following information in your food log:  What you ate.  How much of it you ate.  How  many calories it had.  Then, add up your calories. Keep your food log near you, such as in a small notebook in your pocket. Another option is to use a mobile app or website. Some programs will calculate calories for you and show you how many calories you have left each time you add an item to the log. WHAT ARE SOME CALORIE COUNTING TIPS?  Use your calories on foods and drinks that will fill you up and not leave you hungry. Some examples of this include foods like nuts and nut butters, vegetables, lean proteins, and high-fiber foods (more than 5 g fiber per serving).  Eat nutritious foods and avoid empty calories. Empty calories are calories you get from foods or beverages that do not have many nutrients, such as candy and   soda. It is better to have a nutritious high-calorie food (such as an avocado) than a food with few nutrients (such as a bag of chips).  Know how many calories are in the foods you eat most often. This way, you do not have to look up how many calories they have each time you eat them.  Look out for foods that may seem like low-calorie foods but are really high-calorie foods, such as baked goods, soda, and fat-free candy.  Pay attention to calories in drinks. Drinks such as sodas, specialty coffee drinks, alcohol, and juices have a lot of calories yet do not fill you up. Choose low-calorie drinks like water and diet drinks.  Focus your calorie counting efforts on higher calorie items. Logging the calories in a garden salad that contains only vegetables is less important than calculating the calories in a milk shake.  Find a way of tracking calories that works for you. Get creative. Most people who are successful find ways to keep track of how much they eat in a day, even if they do not count every calorie. WHAT ARE SOME PORTION CONTROL TIPS?  Know how many calories are in a serving. This will help you know how many servings of a certain food you can have.  Use a measuring cup  to measure serving sizes. This is helpful when you start out. With time, you will be able to estimate serving sizes for some foods.  Take some time to put servings of different foods on your favorite plates, bowls, and cups so you know what a serving looks like.  Try not to eat straight from a bag or box. Doing this can lead to overeating. Put the amount you would like to eat in a cup or on a plate to make sure you are eating the right portion.  Use smaller plates, glasses, and bowls to prevent overeating. This is a quick and easy way to practice portion control. If your plate is smaller, less food can fit on it.  Try not to multitask while eating, such as watching TV or using your computer. If it is time to eat, sit down at a table and enjoy your food. Doing this will help you to start recognizing when you are full. It will also make you more aware of what and how much you are eating. HOW CAN I CALORIE COUNT WHEN EATING OUT?  Ask for smaller portion sizes or child-sized portions.  Consider sharing an entree and sides instead of getting your own entree.  If you get your own entree, eat only half. Ask for a box at the beginning of your meal and put the rest of your entree in it so you are not tempted to eat it.  Look for the calories on the menu. If calories are listed, choose the lower calorie options.  Choose dishes that include vegetables, fruits, whole grains, low-fat dairy products, and lean protein. Focusing on smart food choices from each of the 5 food groups can help you stay on track at restaurants.  Choose items that are boiled, broiled, grilled, or steamed.  Choose water, milk, unsweetened iced tea, or other drinks without added sugars. If you want an alcoholic beverage, choose a lower calorie option. For example, a regular margarita can have up to 700 calories and a glass of wine has around 150.  Stay away from items that are buttered, battered, fried, or served with cream sauce.  Items labeled "crispy" are usually fried, unless stated   otherwise.  Ask for dressings, sauces, and syrups on the side. These are usually very high in calories, so do not eat much of them.  Watch out for salads. Many people think salads are a healthy option, but this is often not the case. Many salads come with bacon, fried chicken, lots of cheese, fried chips, and dressing. All of these items have a lot of calories. If you want a salad, choose a garden salad and ask for grilled meats or steak. Ask for the dressing on the side, or ask for olive oil and vinegar or lemon to use as dressing.  Estimate how many servings of a food you are given. For example, a serving of cooked rice is  cup or about the size of half a tennis ball or one cupcake wrapper. Knowing serving sizes will help you be aware of how much food you are eating at restaurants. The list below tells you how big or small some common portion sizes are based on everyday objects.  1 oz--4 stacked dice.  3 oz--1 deck of cards.  1 tsp--1 dice.  1 Tbsp-- a Ping-Pong ball.  2 Tbsp--1 Ping-Pong ball.   cup--1 tennis ball or 1 cupcake wrapper.  1 cup--1 baseball.   This information is not intended to replace advice given to you by your health care provider. Make sure you discuss any questions you have with your health care provider.   Document Released: 10/26/2005 Document Revised: 11/16/2014 Document Reviewed: 08/31/2013 Elsevier Interactive Patient Education 2016 [employer] Inc.   

## 2015-11-23 LAB — CBC WITH DIFFERENTIAL/PLATELET
BASOS ABS: 0 10*3/uL (ref 0.0–0.2)
Basos: 1 %
EOS (ABSOLUTE): 0.1 10*3/uL (ref 0.0–0.4)
Eos: 1 %
HEMOGLOBIN: 12.8 g/dL (ref 11.1–15.9)
Hematocrit: 39 % (ref 34.0–46.6)
IMMATURE GRANS (ABS): 0 10*3/uL (ref 0.0–0.1)
Immature Granulocytes: 0 %
LYMPHS: 40 %
Lymphocytes Absolute: 2.6 10*3/uL (ref 0.7–3.1)
MCH: 25.5 pg — ABNORMAL LOW (ref 26.6–33.0)
MCHC: 32.8 g/dL (ref 31.5–35.7)
MCV: 78 fL — ABNORMAL LOW (ref 79–97)
MONOCYTES: 7 %
Monocytes Absolute: 0.5 10*3/uL (ref 0.1–0.9)
Neutrophils Absolute: 3.3 10*3/uL (ref 1.4–7.0)
Neutrophils: 51 %
PLATELETS: 333 10*3/uL (ref 150–379)
RBC: 5.01 x10E6/uL (ref 3.77–5.28)
RDW: 14.6 % (ref 12.3–15.4)
WBC: 6.4 10*3/uL (ref 3.4–10.8)

## 2015-11-23 LAB — LIPID PANEL
CHOLESTEROL TOTAL: 213 mg/dL — AB (ref 100–199)
Chol/HDL Ratio: 3.6 ratio units (ref 0.0–4.4)
HDL: 59 mg/dL (ref >39)
LDL CALC: 144 mg/dL — AB (ref 0–99)
TRIGLYCERIDES: 52 mg/dL (ref 0–149)
VLDL CHOLESTEROL CAL: 10 mg/dL (ref 5–40)

## 2015-11-23 LAB — HEMOGLOBIN A1C
ESTIMATED AVERAGE GLUCOSE: 120 mg/dL
Hgb A1c MFr Bld: 5.8 % — ABNORMAL HIGH (ref 4.8–5.6)

## 2015-11-23 LAB — TSH: TSH: 3.45 u[IU]/mL (ref 0.450–4.500)

## 2015-11-25 ENCOUNTER — Telehealth: Payer: Self-pay

## 2015-11-25 DIAGNOSIS — E669 Obesity, unspecified: Secondary | ICD-10-CM

## 2015-11-25 DIAGNOSIS — Z6836 Body mass index (BMI) 36.0-36.9, adult: Secondary | ICD-10-CM

## 2015-11-25 MED ORDER — PHENTERMINE HCL 30 MG PO CAPS: 30.0000 mg | ORAL_CAPSULE | ORAL | Status: DC

## 2015-11-25 NOTE — Telephone Encounter (Signed)
Patient advised as directed below. Patient verbalized understanding.   Patient reports she started taking the Phentermine and experienced side effects of feeling "down" and unable to sleep. Patient states the medication did suppress her appetite.   Patient wants to know if she should continue taking the medication or try a different medication. Please advise.

## 2015-11-25 NOTE — Telephone Encounter (Signed)
LMTCB

## 2015-11-25 NOTE — Telephone Encounter (Signed)
Patient advised as directed below.  Thanks,  -Joseline 

## 2015-11-25 NOTE — Telephone Encounter (Signed)
-----   Message from Margaretann LovelessJennifer M Burnette, PA-C sent at 11/25/2015  8:49 AM EST ----- Cholesterol and HgBA1c were slightly elevated.  Continue to watch diet and limit high saturated fats, high cholesterol, and carbohydrates including sugars in diet. May add fish oil 1200 mg twice daily or red yeast rice as natural supplements to help lower cholesterol.  Also adding physical activity 30-40 minutes for 3-4 days per week will help as well.  All labs are ok to be checked in one year.  Thanks.

## 2015-11-25 NOTE — Telephone Encounter (Signed)
We can decrease the dose first to see if that helps.  Then if she is still having side effects we can discontinue and try one of the others.  The other medications will require prior auth from her insurance company. Rx for phentermine 30mg  was printed and will be at front desk for pick up.

## 2015-11-25 NOTE — Telephone Encounter (Signed)
Pt called back.   Please leave message on her voice mail.of what Boneta LucksJenny said.  Thanks, Fortune Brandsteri

## 2015-11-28 ENCOUNTER — Ambulatory Visit
Admission: RE | Admit: 2015-11-28 | Discharge: 2015-11-28 | Disposition: A | Discharge status: Home / Self Care | Payer: Self-pay | Source: Ambulatory Visit | Attending: Physician Assistant | Admitting: Physician Assistant

## 2015-11-28 DIAGNOSIS — Z1239 Encounter for other screening for malignant neoplasm of breast: Secondary | ICD-10-CM

## 2015-11-28 DIAGNOSIS — Z0000 Encounter for general adult medical examination without abnormal findings: Secondary | ICD-10-CM

## 2015-11-28 DIAGNOSIS — Z1231 Encounter for screening mammogram for malignant neoplasm of breast: Secondary | ICD-10-CM | POA: Insufficient documentation

## 2015-11-29 ENCOUNTER — Telehealth: Payer: Self-pay

## 2015-11-29 NOTE — Telephone Encounter (Signed)
-----   Message from Margaretann Loveless, [place] sent at 11/29/2015  8:47 AM EST ----- Normal mammogram. Repeat screening in one year.

## 2015-11-29 NOTE — Telephone Encounter (Signed)
LMTCB  Thanks,  -Sulo Janczak 

## 2015-11-29 NOTE — Telephone Encounter (Signed)
Pt is returning call.  [phone]

## 2015-11-29 NOTE — Telephone Encounter (Signed)
Patient advised as directed below.  Thanks,  -Joseline 

## 2015-12-12 ENCOUNTER — Ambulatory Visit: Payer: 59 | Admitting: Physician Assistant

## 2016-04-08 ENCOUNTER — Encounter: Payer: Self-pay | Admitting: Physician Assistant

## 2016-04-08 ENCOUNTER — Ambulatory Visit (INDEPENDENT_AMBULATORY_CARE_PROVIDER_SITE_OTHER): Payer: 59 | Admitting: Physician Assistant

## 2016-04-08 VITALS — BP 126/72 | HR 58 | Temp 98.3°F | Resp 16 | Ht 67.0 in | Wt 231.0 lb

## 2016-04-08 DIAGNOSIS — Z6836 Body mass index (BMI) 36.0-36.9, adult: Secondary | ICD-10-CM

## 2016-04-08 DIAGNOSIS — Z6837 Body mass index (BMI) 37.0-37.9, adult: Secondary | ICD-10-CM

## 2016-04-08 DIAGNOSIS — Z713 Dietary counseling and surveillance: Secondary | ICD-10-CM | POA: Diagnosis not present

## 2016-04-08 DIAGNOSIS — E669 Obesity, unspecified: Secondary | ICD-10-CM

## 2016-04-08 MED ORDER — PHENTERMINE HCL 37.5 MG PO CAPS
37.5000 mg | ORAL_CAPSULE | ORAL | Status: DC
Start: 2016-04-08 — End: 2016-07-01

## 2016-04-08 NOTE — Patient Instructions (Signed)

## 2016-04-08 NOTE — Progress Notes (Signed)
Patient ID: Amanda Ramsey, female   DOB: [birth year], 40 y.o.   MRN: [id number]       Patient: Amanda Ramsey Female    DOB: [birth year]   40 y.o.   MRN: [id number] Visit Date: 04/08/2016  Today's Provider: Margaretann LovelessJennifer M Lilias Lorensen, PA-C   Chief Complaint  Patient presents with  . Hypertension  . weight management   Subjective:    HPI Amanda Ramsey returns to the office today to follow-up with her weight. She has been dealing with trying to lose weight over the last 5 months. She was given a prescription for phentermine 30 mg in January. She did not return for her initial four-week follow-up. She has lost 4 pounds since she was last seen here at the office. In January 2017 she weighed 235, today she weighs 231. She has been trying to exercise some but states she knows that she needs to do more. She has been adhering to a calorie restricted diet and trying to portion control.    No Known Allergies Previous Medications   METOPROLOL SUCCINATE (TOPROL XL) 100 MG 24 HR TABLET    Take 1 tablet (100 mg total) by mouth daily.   PHENTERMINE 30 MG CAPSULE    Take 1 capsule (30 mg total) by mouth every morning.   TRIAMTERENE-HYDROCHLOROTHIAZIDE (MAXZIDE) 75-50 MG TABLET    Take 1 tablet by mouth daily.   VITAMIN D, ERGOCALCIFEROL, (DRISDOL) 50000 UNITS CAPS CAPSULE    Take 1 capsule (50,000 Units total) by mouth every 30 (thirty) days.    Review of Systems  Constitutional: Negative.   Respiratory: Negative.   Cardiovascular: Negative.   Musculoskeletal: Negative.   Psychiatric/Behavioral: Negative.     Social History  Substance Use Topics  . Smoking status: Never Smoker   . Smokeless tobacco: Not on file  . Alcohol Use: 0.0 oz/week    0 Standard drinks or equivalent per week     Comment: occasionally   Objective:   BP 126/72 mmHg  Pulse 58  Temp(Src) 98.3 F (36.8 C)  Resp 16  Ht 5\' 7"  (1.702 m)  Wt 231 lb (104.781 kg)  BMI 36.17 kg/m2  Physical Exam  Constitutional: She  appears well-developed and well-nourished. No distress.  Neck: Normal range of motion. Neck supple.  Cardiovascular: Normal rate, regular rhythm and normal heart sounds.  Exam reveals no gallop and no friction rub.   No murmur heard. Pulmonary/Chest: Effort normal and breath sounds normal. No respiratory distress. She has no wheezes. She has no rales.  Skin: She is not diaphoretic.  Psychiatric: She has a normal mood and affect. Her behavior is normal. Judgment and thought content normal.  Vitals reviewed.      Assessment & Plan:     1. Encounter for weight loss counseling She is going to continue her calorie restricted diet and tried to add more exercise into her daily routine. She is going to try to get 150 minutes of exercise per week per AHA guidelines. I will prescribe phentermine as below for appetite suppression. I will see her back in 4 weeks to evaluate her weight loss at that time. - phentermine 37.5 MG capsule; Take 1 capsule (37.5 mg total) by mouth every morning.  Dispense: 30 capsule; Refill: 0  2. Obesity See above medical treatment plan.  3. BMI 36.0-36.9,adult See above medical treatment plan.       Margaretann LovelessJennifer M Ronzell Laban, PA-C  [facility]

## 2016-04-28 ENCOUNTER — Encounter: Payer: Self-pay | Admitting: Physician Assistant

## 2016-04-28 ENCOUNTER — Ambulatory Visit (INDEPENDENT_AMBULATORY_CARE_PROVIDER_SITE_OTHER): Payer: 59 | Admitting: Physician Assistant

## 2016-04-28 VITALS — BP 136/80 | HR 60 | Temp 98.6°F | Resp 16 | Wt 231.8 lb

## 2016-04-28 DIAGNOSIS — K2940 Chronic atrophic gastritis without bleeding: Secondary | ICD-10-CM | POA: Diagnosis not present

## 2016-04-28 DIAGNOSIS — K2970 Gastritis, unspecified, without bleeding: Principal | ICD-10-CM

## 2016-04-28 DIAGNOSIS — B9681 Helicobacter pylori [H. pylori] as the cause of diseases classified elsewhere: Secondary | ICD-10-CM

## 2016-04-28 MED ORDER — AMOXICILL-CLARITHRO-LANSOPRAZ PO MISC: Freq: Two times a day (BID) | ORAL | Status: DC

## 2016-04-28 NOTE — Patient Instructions (Addendum)
Gastritis, Adult Gastritis is soreness and swelling (inflammation) of the lining of the stomach. Gastritis can develop as a sudden onset (acute) or long-term (chronic) condition. If gastritis is not treated, it can lead to stomach bleeding and ulcers. CAUSES  Gastritis occurs when the stomach lining is weak or damaged. Digestive juices from the stomach then inflame the weakened stomach lining. The stomach lining may be weak or damaged due to viral or bacterial infections. One common bacterial infection is the Helicobacter pylori infection. Gastritis can also result from excessive alcohol consumption, taking certain medicines, or having too much acid in the stomach.  SYMPTOMS  In some cases, there are no symptoms. When symptoms are present, they may include:  Pain or a burning sensation in the upper abdomen.  Nausea.  Vomiting.  An uncomfortable feeling of fullness after eating. DIAGNOSIS  Your caregiver may suspect you have gastritis based on your symptoms and a physical exam. To determine the cause of your gastritis, your caregiver may perform the following:  Blood or stool tests to check for the H pylori bacterium.  Gastroscopy. A thin, flexible tube (endoscope) is passed down the esophagus and into the stomach. The endoscope has a light and camera on the end. Your caregiver uses the endoscope to view the inside of the stomach.  Taking a tissue sample (biopsy) from the stomach to examine under a microscope. TREATMENT  Depending on the cause of your gastritis, medicines may be prescribed. If you have a bacterial infection, such as an H pylori infection, antibiotics may be given. If your gastritis is caused by too much acid in the stomach, H2 blockers or antacids may be given. Your caregiver may recommend that you stop taking aspirin, ibuprofen, or other nonsteroidal anti-inflammatory drugs (NSAIDs). HOME CARE INSTRUCTIONS  Only take over-the-counter or prescription medicines as directed by  your caregiver.  If you were given antibiotic medicines, take them as directed. Finish them even if you start to feel better.  Drink enough fluids to keep your urine clear or pale yellow.  Avoid foods and drinks that make your symptoms worse, such as:  Caffeine or alcoholic drinks.  Chocolate.  Peppermint or mint flavorings.  Garlic and onions.  Spicy foods.  Citrus fruits, such as oranges, lemons, or limes.  Tomato-based foods such as sauce, chili, salsa, and pizza.  Fried and fatty foods.  Eat small, frequent meals instead of large meals. SEEK IMMEDIATE MEDICAL CARE IF:   You have black or dark red stools.  You vomit blood or material that looks like coffee grounds.  You are unable to keep fluids down.  Your abdominal pain gets worse.  You have a fever.  You do not feel better after 1 week.  You have any other questions or concerns. MAKE SURE YOU:  Understand these instructions.  Will watch your condition.  Will get help right away if you are not doing well or get worse.   This information is not intended to replace advice given to you by your health care provider. Make sure you discuss any questions you have with your health care provider.   Document Released: 10/20/2001 Document Revised: 04/26/2012 Document Reviewed: 12/09/2011 Elsevier Interactive Patient Education 2016 Elsevier Inc.  Amoxicillin; Clarithromycin; Lansoprazole tablets and capsules What is this medicine? AMOXICILLIN; CLARITHROMYCIN; LANSOPRAZOLE (a mox i SIL in; kla RITH roe mye sin; lan SOE pra zole) is a combination of three medicines used to treat ulcers associated with a bacterial infection. This medicine may be used for other  purposes; ask your health care provider or pharmacist if you have questions. What should I tell my health care provider before I take this medicine? They need to know if you have any of these conditions: -any chronic illness -kidney disease -liver  disease -an unusual or allergic reaction to amoxicillin, clarithromycin, lansoprazole, other antibiotics, other medicines, foods, dyes, or preservatives -pregnant or trying to get pregnant -breast-feeding How should I use this medicine? Take these medicines by mouth with a full glass of water. Each dose should be taken twice per day before eating. Follow the directions on the prescription label. Do not crush or chew. Take your doses at regular intervals. Do not take your medicine more often than directed. Do not skip doses or stop your medicine early even if you feel better. Do not stop taking except on your doctor's advice. Talk to your pediatrician regarding the use of this medicine in children. Special care may be needed. Overdosage: If you think you have taken too much of this medicine contact a poison control center or emergency room at once. NOTE: This medicine is only for you. Do not share this medicine with others. What if I miss a dose? If you miss a dose, take it as soon as you can. If it is almost time for your next dose, take only that dose. Do not take double or extra doses. What may interact with this medicine? Do not take this medicine with any of the following medications: -atazanavir -cisapride -dihydroergotamine, ergotamine -quinolone antibiotics like gatifloxacin, grepafloxacin, sparfloxacin -some heart medicines like amiodarone, bepridil, dofetilide, droperidol, flecainide, ibutiline, procainamide, quinidine, sotalol -some medicines for cholesterol like cerivastatin, lovastatin, simvastatin -medicines for depression, sleep, or psychotic disturbances -red yeast rice This medicine may also interact with the following medications: -arsenic trioxide -carbamazepine -cyclosporine -digoxin -diltiazem -dolasetron -female hormones, like estrogens or progestins and birth control pills -iron supplements -itraconazole, ketoconazole -methotrexate -other  antibiotics -sildenafil -some medicines for HIV-infection or AIDS -sucralfate -tacrolimus -theophylline -warfarin This list may not describe all possible interactions. Give your health care provider a list of all the medicines, herbs, non-prescription drugs, or dietary supplements you use. Also tell them if you smoke, drink alcohol, or use illegal drugs. Some items may interact with your medicine. What should I watch for while using this medicine? Visit your doctor or health care professional for regular check ups. Tell your doctor or if your symptoms do not improve or if they get worse. Call your doctor as soon as you can if you get a fever, watery diarrhea, stomach pain, or vomiting. These could be symptoms of a more serious illness. Do not treat yourself. Call your doctor for advice. Birth control pills may not work properly while you are taking this medicine. Talk to your doctor about using an extra method of birth control. If you are diabetic you may get a false-positive result for sugar in your urine. Talk to your health care provider. What side effects may I notice from receiving this medicine? Side effects that you should report to your doctor or health care professional as soon as possible: -allergic reactions like skin rash, itching or hives, swelling of the face, lips, or tongue -bone, muscle or joint pain -breathing problems -dizzy, confused -fever, chills -redness, blistering, peeling or loosening of the skin, including inside the mouth -stomach pain, cramps -trouble passing urine or change in the amount of urine -unusual bleeding, bruising -unusually weak or tired -vaginal itching, discharge -vomiting Side effects that usually do not require medical  attention (report to your doctor or health care professional if they continue or are bothersome): -dry mouth, thirst -headache -loss of appetite -stomach upset, nausea -unusual taste This list may not describe all possible  side effects. Call your doctor for medical advice about side effects. You may report side effects to FDA at 1-800-FDA-1088. Where should I keep my medicine? Keep out of the reach of children. Store at room temperature between 20 and 25 degrees C (68 and 77 degrees F). Protect from light and moisture. Throw away any unused medicine after the expiration date. NOTE: This sheet is a summary. It may not cover all possible information. If you have questions about this medicine, talk to your doctor, pharmacist, or health care provider.    2016, Elsevier/Gold Standard. (2009-04-03 21:58:55)

## 2016-04-28 NOTE — Progress Notes (Signed)
Patient: Amanda Ramsey Female    DOB: 1976/09/04   39 y.o.   MRN: [id number] Visit Date: 04/28/2016  Today's Provider: Mar Daring, PA-C   Chief Complaint  Patient presents with  . Abdominal Pain   Subjective:    Abdominal Pain This is a new (She reports that she had this stomach before several years ago.) problem. The current episode started 1 to 4 weeks ago. Episode frequency: is not constant. Yesterday it was discomfort and she just don't know when it will feel discomfort. The problem has been gradually worsening. The pain is located in the RLQ. The patient is experiencing no pain. Quality: when the discomfort start she feels bloated with the need to pass gas. She is also getting cramping in the middle of her stomach. The abdominal pain does not radiate. Associated symptoms include constipation (in betwwen loose stool with normal and feels like going but doesn't have a bowel  movement even thou she feels like going.) and nausea (occasional). Pertinent negatives include no fever or vomiting. The pain is relieved by nothing. Treatments tried: baking soda.   She states she had similar symptoms a few years ago and was found to have H.pylori gastritis. She was treated and symptoms improved. She states these symptoms feel exactly like that.     No Known Allergies Current Meds  Medication Sig  . metoprolol succinate (TOPROL XL) 100 MG 24 hr tablet Take 1 tablet (100 mg total) by mouth daily.  Marland Kitchen triamterene-hydrochlorothiazide (MAXZIDE) 75-50 MG tablet Take 1 tablet by mouth daily.  . Vitamin D, Ergocalciferol, (DRISDOL) 50000 UNITS CAPS capsule Take 1 capsule (50,000 Units total) by mouth every 30 (thirty) days.    Review of Systems  Constitutional: Negative for fever, chills and fatigue.  Respiratory: Negative for cough, chest tightness and shortness of breath.   Cardiovascular: Negative for chest pain, palpitations and leg swelling.  Gastrointestinal: Positive for  nausea (occasional), abdominal pain (wax and wanes), constipation (in betwwen loose stool with normal and feels like going but doesn't have a bowel  movement even thou she feels like going.) and abdominal distention (occasional). Negative for vomiting.  Genitourinary: Negative.   Musculoskeletal: Negative for back pain.    Social History  Substance Use Topics  . Smoking status: Never Smoker   . Smokeless tobacco: Not on file  . Alcohol Use: 0.0 oz/week    0 Standard drinks or equivalent per week     Comment: occasionally   Objective:   BP 136/80 mmHg  Pulse 60  Temp(Src) 98.6 F (37 C) (Oral)  Resp 16  Wt 231 lb 12.8 oz (105.144 kg)  Physical Exam  Constitutional: She is oriented to person, place, and time. She appears well-developed and well-nourished. No distress.  Cardiovascular: Normal rate, regular rhythm and normal heart sounds.  Exam reveals no gallop and no friction rub.   No murmur heard. Pulmonary/Chest: Effort normal and breath sounds normal. No respiratory distress. She has no wheezes. She has no rales.  Abdominal: Soft. Normal appearance and bowel sounds are normal. She exhibits no distension and no mass. There is no hepatosplenomegaly. There is tenderness in the right upper quadrant, right lower quadrant and epigastric area. There is no rebound, no guarding and no CVA tenderness.  Neurological: She is alert and oriented to person, place, and time.  Skin: Skin is warm and dry. She is not diaphoretic.  Vitals reviewed.      Assessment & Plan:  1. Gastritis, Helicobacter pylori Due to fact that symptoms feel exactly like they did previously when she had H.Pylori gastritis will treat as below with PrevPac. She is to call if symptoms worsen or fail to improve and we will work-up further to make sure not constipation. Unlikely appendicitis, but she does still have appendix so will monitor for worsening of symptoms. There was no guarding on exam. I will see her back in 2  weeks. - amoxicillin-clarithromycin-lansoprazole (PREVPAC) combo pack; Take by mouth 2 (two) times daily. Follow package directions.  Dispense: 1 kit; Refill: 0       Mar Daring, PA-C  [facility]

## 2016-05-06 ENCOUNTER — Ambulatory Visit: Payer: 59 | Admitting: Physician Assistant

## 2016-05-13 ENCOUNTER — Encounter: Payer: Self-pay | Admitting: Physician Assistant

## 2016-05-13 ENCOUNTER — Ambulatory Visit (INDEPENDENT_AMBULATORY_CARE_PROVIDER_SITE_OTHER): Payer: 59 | Admitting: Physician Assistant

## 2016-05-13 VITALS — BP 130/80 | HR 77 | Temp 98.3°F | Resp 16 | Wt 230.6 lb

## 2016-05-13 DIAGNOSIS — E876 Hypokalemia: Secondary | ICD-10-CM

## 2016-05-13 DIAGNOSIS — Z8249 Family history of ischemic heart disease and other diseases of the circulatory system: Secondary | ICD-10-CM | POA: Diagnosis not present

## 2016-05-13 DIAGNOSIS — I10 Essential (primary) hypertension: Secondary | ICD-10-CM

## 2016-05-13 DIAGNOSIS — K2970 Gastritis, unspecified, without bleeding: Secondary | ICD-10-CM

## 2016-05-13 DIAGNOSIS — B9681 Helicobacter pylori [H. pylori] as the cause of diseases classified elsewhere: Secondary | ICD-10-CM | POA: Diagnosis not present

## 2016-05-13 DIAGNOSIS — R7303 Prediabetes: Secondary | ICD-10-CM

## 2016-05-13 NOTE — Progress Notes (Signed)
       Patient: Amanda Ramsey Female    DOB: 05/01/1976   40 y.o.   MRN: [id number] Visit Date: 05/13/2016  Today's Provider: Margaretann LovelessJennifer M Kebin Maye, PA-C   Chief Complaint  Patient presents with  . Follow-up    Abdominal Pain   Subjective:    HPI Patient is here to follow-up on Abdominal Pain. She was seen 2 week ago. She was treated with PrevPac. She reports that she feels much better.  She will also be due for her biometric screen the last week of August. She is wanting to get labs ordered today for her to have done the week before she returns for her biometric screen exam. She will return the week before the form is due.    No Known Allergies Current Meds  Medication Sig  . metoprolol succinate (TOPROL XL) 100 MG 24 hr tablet Take 1 tablet (100 mg total) by mouth daily.  Marland Kitchen. triamterene-hydrochlorothiazide (MAXZIDE) 75-50 MG tablet Take 1 tablet by mouth daily.  . Vitamin D, Ergocalciferol, (DRISDOL) 50000 UNITS CAPS capsule Take 1 capsule (50,000 Units total) by mouth every 30 (thirty) days.    Review of Systems  Constitutional: Negative for fever, chills and fatigue.  Respiratory: Negative for cough, chest tightness and shortness of breath.   Cardiovascular: Negative for chest pain, palpitations and leg swelling.  Gastrointestinal: Negative for nausea, vomiting, abdominal pain, diarrhea, constipation and blood in stool.  Musculoskeletal: Negative for back pain.    Social History  Substance Use Topics  . Smoking status: Never Smoker   . Smokeless tobacco: Not on file  . Alcohol Use: 0.0 oz/week    0 Standard drinks or equivalent per week     Comment: occasionally   Objective:   BP 130/80 mmHg  Pulse 77  Temp(Src) 98.3 F (36.8 C) (Oral)  Resp 16  Wt 230 lb 9.6 oz (104.599 kg)  Physical Exam  Constitutional: She appears well-developed and well-nourished. No distress.  Cardiovascular: Normal rate, regular rhythm and normal heart sounds.  Exam reveals no gallop  and no friction rub.   No murmur heard. Pulmonary/Chest: Effort normal and breath sounds normal. No respiratory distress. She has no wheezes. She has no rales.  Abdominal: Soft. Bowel sounds are normal. She exhibits no distension and no mass. There is no tenderness. There is no rebound and no guarding.  Skin: She is not diaphoretic.  Vitals reviewed.     Assessment & Plan:     1. Borderline diabetes Will check labs as below and f/u pending results. Will see her near end of August for biometric screen. - HgB A1c - Comprehensive Metabolic Panel (CMET)  2. Essential (primary) hypertension Will check labs as below and f/u pending results. - CBC with Differential - Comprehensive Metabolic Panel (CMET)  3. Family history of cardiovascular disease Will check labs as below and f/u pending results. - Lipid Profile  4. Decreased potassium in the blood Will check labs as below and f/u pending results. - Comprehensive Metabolic Panel (CMET)  5. Gastritis, Helicobacter pylori Improving. Today is last day of treatment. She is to call if symptoms return.       Margaretann LovelessJennifer M Brandyn Thien, PA-C  [facility]

## 2016-05-13 NOTE — Patient Instructions (Signed)
Food Choices for Gastroesophageal Reflux Disease, Adult When you have gastroesophageal reflux disease (GERD), the foods you eat and your eating habits are very important. Choosing the right foods can help ease the discomfort of GERD. WHAT GENERAL GUIDELINES DO I NEED TO FOLLOW?  Choose fruits, vegetables, whole grains, low-fat dairy products, and low-fat meat, fish, and poultry.  Limit fats such as oils, salad dressings, butter, nuts, and avocado.  Keep a food diary to identify foods that cause symptoms.  Avoid foods that cause reflux. These may be different for different people.  Eat frequent small meals instead of three large meals each day.  Eat your meals slowly, in a relaxed setting.  Limit fried foods.  Cook foods using methods other than frying.  Avoid drinking alcohol.  Avoid drinking large amounts of liquids with your meals.  Avoid bending over or lying down until 2-3 hours after eating. WHAT FOODS ARE NOT RECOMMENDED? The following are some foods and drinks that may worsen your symptoms: Vegetables Tomatoes. Tomato juice. Tomato and spaghetti sauce. Chili peppers. Onion and garlic. Horseradish. Fruits Oranges, grapefruit, and lemon (fruit and juice). Meats High-fat meats, fish, and poultry. This includes hot dogs, ribs, ham, sausage, salami, and bacon. Dairy Whole milk and chocolate milk. Sour cream. Cream. Butter. Ice cream. Cream cheese.  Beverages Coffee and tea, with or without caffeine. Carbonated beverages or energy drinks. Condiments Hot sauce. Barbecue sauce.  Sweets/Desserts Chocolate and cocoa. Donuts. Peppermint and spearmint. Fats and Oils High-fat foods, including French fries and potato chips. Other Vinegar. Strong spices, such as black pepper, white pepper, red pepper, cayenne, curry powder, cloves, ginger, and chili powder. The items listed above may not be a complete list of foods and beverages to avoid. Contact your dietitian for more  information.   This information is not intended to replace advice given to you by your health care provider. Make sure you discuss any questions you have with your health care provider.   Document Released: 10/26/2005 Document Revised: 11/16/2014 Document Reviewed: 08/30/2013 Elsevier Interactive Patient Education 2016 Elsevier Inc.  

## 2016-06-24 LAB — COMPREHENSIVE METABOLIC PANEL
A/G RATIO: 1.3 (ref 1.2–2.2)
ALK PHOS: 39 IU/L (ref 39–117)
ALT: 20 IU/L (ref 0–32)
AST: 16 IU/L (ref 0–40)
Albumin: 4.5 g/dL (ref 3.5–5.5)
BUN/Creatinine Ratio: 20 (ref 9–23)
BUN: 18 mg/dL (ref 6–24)
Bilirubin Total: 0.5 mg/dL (ref 0.0–1.2)
CO2: 25 mmol/L (ref 18–29)
CREATININE: 0.91 mg/dL (ref 0.57–1.00)
Calcium: 9.5 mg/dL (ref 8.7–10.2)
Chloride: 100 mmol/L (ref 96–106)
GFR calc Af Amer: 91 mL/min/{1.73_m2} (ref >59)
GFR calc non Af Amer: 79 mL/min/{1.73_m2} (ref >59)
GLOBULIN, TOTAL: 3.5 g/dL (ref 1.5–4.5)
Glucose: 101 mg/dL — ABNORMAL HIGH (ref 65–99)
POTASSIUM: 3.7 mmol/L (ref 3.5–5.2)
SODIUM: 143 mmol/L (ref 134–144)
Total Protein: 8 g/dL (ref 6.0–8.5)

## 2016-06-24 LAB — LIPID PANEL
CHOL/HDL RATIO: 3.1 ratio (ref 0.0–4.4)
Cholesterol, Total: 195 mg/dL (ref 100–199)
HDL: 62 mg/dL (ref >39)
LDL Calculated: 120 mg/dL — ABNORMAL HIGH (ref 0–99)
Triglycerides: 67 mg/dL (ref 0–149)
VLDL CHOLESTEROL CAL: 13 mg/dL (ref 5–40)

## 2016-06-24 LAB — CBC WITH DIFFERENTIAL/PLATELET
Basophils Absolute: 0 10*3/uL (ref 0.0–0.2)
Basos: 0 %
EOS (ABSOLUTE): 0.1 10*3/uL (ref 0.0–0.4)
EOS: 2 %
HEMATOCRIT: 38.4 % (ref 34.0–46.6)
Hemoglobin: 12.5 g/dL (ref 11.1–15.9)
Immature Grans (Abs): 0 10*3/uL (ref 0.0–0.1)
Immature Granulocytes: 0 %
LYMPHS ABS: 2.7 10*3/uL (ref 0.7–3.1)
Lymphs: 41 %
MCH: 25.9 pg — ABNORMAL LOW (ref 26.6–33.0)
MCHC: 32.6 g/dL (ref 31.5–35.7)
MCV: 80 fL (ref 79–97)
MONOS ABS: 0.4 10*3/uL (ref 0.1–0.9)
Monocytes: 5 %
Neutrophils Absolute: 3.4 10*3/uL (ref 1.4–7.0)
Neutrophils: 52 %
PLATELETS: 361 10*3/uL (ref 150–379)
RBC: 4.83 x10E6/uL (ref 3.77–5.28)
RDW: 14.2 % (ref 12.3–15.4)
WBC: 6.6 10*3/uL (ref 3.4–10.8)

## 2016-06-24 LAB — HEMOGLOBIN A1C
Est. average glucose Bld gHb Est-mCnc: 126 mg/dL
HEMOGLOBIN A1C: 6 % — AB (ref 4.8–5.6)

## 2016-07-01 ENCOUNTER — Ambulatory Visit (INDEPENDENT_AMBULATORY_CARE_PROVIDER_SITE_OTHER): Payer: 59 | Admitting: Physician Assistant

## 2016-07-01 ENCOUNTER — Encounter: Payer: Self-pay | Admitting: Physician Assistant

## 2016-07-01 ENCOUNTER — Other Ambulatory Visit: Payer: Self-pay | Admitting: Physician Assistant

## 2016-07-01 VITALS — BP 128/84 | HR 78 | Resp 16 | Ht 67.0 in | Wt 235.8 lb

## 2016-07-01 DIAGNOSIS — E669 Obesity, unspecified: Secondary | ICD-10-CM

## 2016-07-01 DIAGNOSIS — Z713 Dietary counseling and surveillance: Secondary | ICD-10-CM

## 2016-07-01 DIAGNOSIS — Z0189 Encounter for other specified special examinations: Secondary | ICD-10-CM

## 2016-07-01 DIAGNOSIS — Z008 Encounter for other general examination: Secondary | ICD-10-CM

## 2016-07-01 DIAGNOSIS — Z6836 Body mass index (BMI) 36.0-36.9, adult: Secondary | ICD-10-CM | POA: Diagnosis not present

## 2016-07-01 DIAGNOSIS — I10 Essential (primary) hypertension: Secondary | ICD-10-CM | POA: Diagnosis not present

## 2016-07-01 MED ORDER — METOPROLOL SUCCINATE ER 100 MG PO TB24: 100.0000 mg | ORAL_TABLET | Freq: Every day | ORAL | 11 refills | Status: DC

## 2016-07-01 MED ORDER — LORCASERIN HCL 10 MG PO TABS: 10.0000 mg | ORAL_TABLET | Freq: Every day | ORAL | 0 refills | Status: DC

## 2016-07-01 NOTE — Progress Notes (Signed)
   Patient: Amanda Ramsey Female    DOB: [birth year]   40 y.o.   MRN: [id number] Visit Date: 07/01/2016  Today's Provider: Margaretann LovelessJennifer M Bland Rudzinski, PA-C   No chief complaint on file.  Subjective:    HPI Patient is here to have a biometric screen done. Patient had labs done on 06/23/2016. She feels well and has no complaints.      Previous Medications   AMOXICILLIN-CLARITHROMYCIN-LANSOPRAZOLE (PREVPAC) COMBO PACK    Take by mouth 2 (two) times daily. Follow package directions.   METOPROLOL SUCCINATE (TOPROL XL) 100 MG 24 HR TABLET    Take 1 tablet (100 mg total) by mouth daily.   PHENTERMINE 37.5 MG CAPSULE    Take 1 capsule (37.5 mg total) by mouth every morning.   TRIAMTERENE-HYDROCHLOROTHIAZIDE (MAXZIDE) 75-50 MG TABLET    Take 1 tablet by mouth daily.   VITAMIN D, ERGOCALCIFEROL, (DRISDOL) 50000 UNITS CAPS CAPSULE    Take 1 capsule (50,000 Units total) by mouth every 30 (thirty) days.    Review of Systems  Constitutional: Negative.   Respiratory: Negative.   Cardiovascular: Negative.   Gastrointestinal: Negative.   Genitourinary: Negative.   Musculoskeletal: Negative.   Neurological: Negative.   Psychiatric/Behavioral: Negative.     Social History  Substance Use Topics  . Smoking status: Never Smoker  . Smokeless tobacco: Not on file  . Alcohol use 0.0 oz/week     Comment: occasionally   Objective:   There were no vitals taken for this visit.  Physical Exam  Constitutional: She appears well-developed and well-nourished. No distress.  Neck: Normal range of motion. Neck supple. No tracheal deviation present. No thyromegaly present.  Cardiovascular: Normal rate, regular rhythm and normal heart sounds.  Exam reveals no gallop and no friction rub.   No murmur heard. Pulmonary/Chest: Effort normal and breath sounds normal. No respiratory distress. She has no wheezes. She has no rales.  Musculoskeletal: She exhibits no edema.  Lymphadenopathy:    She has no cervical  adenopathy.  Skin: She is not diaphoretic.  Psychiatric: She has a normal mood and affect. Her behavior is normal. Judgment and thought content normal.  Vitals reviewed.     Assessment & Plan:     1. Encounter for biometric screening Biometric form completed for patient and original given to patient.  2. Essential (primary) hypertension Stable. Diagnosis pulled for medication refill. Continue current medical treatment plan. - metoprolol succinate (TOPROL XL) 100 MG 24 hr tablet; Take 1 tablet (100 mg total) by mouth daily.  Dispense: 30 tablet; Refill: 11  3. Encounter for weight loss counseling Has tried phentermine previously but did not tolerate. Will try belviq once daily as below. If tolerated will increase to BID at f/u. Will see her in 4 weeks for weight recheck. - Lorcaserin HCl (BELVIQ) 10 MG TABS; Take 10 mg by mouth daily.  Dispense: 30 tablet; Refill: 0  4. BMI 36.0-36.9,adult See above medical treatment plan. - Lorcaserin HCl (BELVIQ) 10 MG TABS; Take 10 mg by mouth daily.  Dispense: 30 tablet; Refill: 0  5. Obese See above medical treatment plan. - Lorcaserin HCl (BELVIQ) 10 MG TABS; Take 10 mg by mouth daily.  Dispense: 30 tablet; Refill: 0   Follow up: No Follow-up on file.

## 2016-07-01 NOTE — Patient Instructions (Addendum)
DASH Eating Plan DASH stands for "Dietary Approaches to Stop Hypertension." The DASH eating plan is a healthy eating plan that has been shown to reduce high blood pressure (hypertension). Additional health benefits may include reducing the risk of type 2 diabetes mellitus, heart disease, and stroke. The DASH eating plan may also help with weight loss. WHAT DO I NEED TO KNOW ABOUT THE DASH EATING PLAN? For the DASH eating plan, you will follow these general guidelines:  Choose foods with a percent daily value for sodium of less than 5% (as listed on the food label).  Use salt-free seasonings or herbs instead of table salt or sea salt.  Check with your health care provider or pharmacist before using salt substitutes.  Eat lower-sodium products, often labeled as "lower sodium" or "no salt added."  Eat fresh foods.  Eat more vegetables, fruits, and low-fat dairy products.  Choose whole grains. Look for the word "whole" as the first word in the ingredient list.  Choose fish and skinless chicken or turkey more often than red meat. Limit fish, poultry, and meat to 6 oz (170 g) each day.  Limit sweets, desserts, sugars, and sugary drinks.  Choose heart-healthy fats.  Limit cheese to 1 oz (28 g) per day.  Eat more home-cooked food and less restaurant, buffet, and fast food.  Limit fried foods.  Cook foods using methods other than frying.  Limit canned vegetables. If you do use them, rinse them well to decrease the sodium.  When eating at a restaurant, ask that your food be prepared with less salt, or no salt if possible. WHAT FOODS CAN I EAT? Seek help from a dietitian for individual calorie needs. Grains Whole grain or whole wheat bread. Brown rice. Whole grain or whole wheat pasta. Quinoa, bulgur, and whole grain cereals. Low-sodium cereals. Corn or whole wheat flour tortillas. Whole grain cornbread. Whole grain crackers. Low-sodium crackers. Vegetables Fresh or frozen vegetables  (raw, steamed, roasted, or grilled). Low-sodium or reduced-sodium tomato and vegetable juices. Low-sodium or reduced-sodium tomato sauce and paste. Low-sodium or reduced-sodium canned vegetables.  Fruits All fresh, canned (in natural juice), or frozen fruits. Meat and Other Protein Products Ground beef (85% or leaner), grass-fed beef, or beef trimmed of fat. Skinless chicken or turkey. Ground chicken or turkey. Pork trimmed of fat. All fish and seafood. Eggs. Dried beans, peas, or lentils. Unsalted nuts and seeds. Unsalted canned beans. Dairy Low-fat dairy products, such as skim or 1% milk, 2% or reduced-fat cheeses, low-fat ricotta or cottage cheese, or plain low-fat yogurt. Low-sodium or reduced-sodium cheeses. Fats and Oils Tub margarines without trans fats. Light or reduced-fat mayonnaise and salad dressings (reduced sodium). Avocado. Safflower, olive, or canola oils. Natural peanut or almond butter. Other Unsalted popcorn and pretzels. The items listed above may not be a complete list of recommended foods or beverages. Contact your dietitian for more options. WHAT FOODS ARE NOT RECOMMENDED? Grains White bread. White pasta. White rice. Refined cornbread. Bagels and croissants. Crackers that contain trans fat. Vegetables Creamed or fried vegetables. Vegetables in a cheese sauce. Regular canned vegetables. Regular canned tomato sauce and paste. Regular tomato and vegetable juices. Fruits Dried fruits. Canned fruit in light or heavy syrup. Fruit juice. Meat and Other Protein Products Fatty cuts of meat. Ribs, chicken wings, bacon, sausage, bologna, salami, chitterlings, fatback, hot dogs, bratwurst, and packaged luncheon meats. Salted nuts and seeds. Canned beans with salt. Dairy Whole or 2% milk, cream, half-and-half, and cream cheese. Whole-fat or sweetened yogurt. Full-fat   cheeses or blue cheese. Nondairy creamers and whipped toppings. Processed cheese, cheese spreads, or cheese  curds. Condiments Onion and garlic salt, seasoned salt, table salt, and sea salt. Canned and packaged gravies. Worcestershire sauce. Tartar sauce. Barbecue sauce. Teriyaki sauce. Soy sauce, including reduced sodium. Steak sauce. Fish sauce. Oyster sauce. Cocktail sauce. Horseradish. Ketchup and mustard. Meat flavorings and tenderizers. Bouillon cubes. Hot sauce. Tabasco sauce. Marinades. Taco seasonings. Relishes. Fats and Oils Butter, stick margarine, lard, shortening, ghee, and bacon fat. Coconut, palm kernel, or palm oils. Regular salad dressings. Other Pickles and olives. Salted popcorn and pretzels. The items listed above may not be a complete list of foods and beverages to avoid. Contact your dietitian for more information. WHERE CAN I FIND MORE INFORMATION? National Heart, Lung, and Blood Institute: CablePromo.itwww.nhlbi.nih.gov/health/health-topics/topics/dash/   This information is not intended to replace advice given to you by your health care provider. Make sure you discuss any questions you have with your health care provider.   Document Released: 10/15/2011 Document Revised: 11/16/2014 Document Reviewed: 08/30/2013 Elsevier Interactive Patient Education 2016 Elsevier Inc.  Lorcaserin oral tablets What is this medicine? LORCASERIN (lor ca SER in) is used to promote and maintain weight loss in obese patients. This medicine should be used with a reduced calorie diet and, if appropriate, an exercise program. This medicine may be used for other purposes; ask your health care provider or pharmacist if you have questions. What should I tell my health care provider before I take this medicine? They need to know if you have any of these conditions: -anatomical deformation of the penis, Peyronie's disease, or history of priapism (painful and prolonged erection) -diabetes -heart disease -history of blood diseases, like sickle cell anemia or leukemia -history of irregular heartbeat -kidney  disease -liver disease -suicidal thoughts, plans, or attempt; a previous suicide attempt by you or a family member -an unusual or allergic reaction to lorcaserin, other medicines, foods, dyes, or preservatives -pregnant or trying to get pregnant -breast-feeding How should I use this medicine? Take this medicine by mouth with a glass of water. Follow the directions on the prescription label. You can take it with or without food. Take your medicine at regular intervals. Do not take it more often than directed. Do not stop taking except on your doctor's advice. Talk to your pediatrician regarding the use of this medicine in children. Special care may be needed. Overdosage: If you think you have taken too much of this medicine contact a poison control center or emergency room at once. NOTE: This medicine is only for you. Do not share this medicine with others. What if I miss a dose? If you miss a dose, take it as soon as you can. If it is almost time for your next dose, take only that dose. Do not take double or extra doses. What may interact with this medicine? -cabergoline -certain medicines for depression, anxiety, or psychotic disturbances -certain medicines for erectile dysfunction -certain medicines for migraine headache like almotriptan, eletriptan, frovatriptan, naratriptan, rizatriptan, sumatriptan, zolmitriptan -dextromethorphan -linezolid -lithium -medicines for diabetes -other weight loss products -tramadol -St. John's Wort -stimulant medicines for attention disorders, weight loss, or to stay awake -tryptophan This list may not describe all possible interactions. Give your health care provider a list of all the medicines, herbs, non-prescription drugs, or dietary supplements you use. Also tell them if you smoke, drink alcohol, or use illegal drugs. Some items may interact with your medicine. What should I watch for while using this medicine? This  medicine is intended to be used  in addition to a healthy diet and appropriate exercise. The best results are achieved this way. Your doctor should instruct you to stop taking this medicine if you do not lose a certain amount of weight within the first 12 weeks of treatment, but it is important that you do not change your dose in any way without consulting your doctor or health care professional. Visit your doctor or health care professional for regular checkups. Your doctor may order blood tests or other tests to see how you are doing. Do not drive, use machinery, or do anything that needs mental alertness until you know how this medicine affects you. This medicine may affect blood sugar levels. If you have diabetes, check with your doctor or health care professional before you change your diet or the dose of your diabetic medicine. Patients and their families should watch out for worsening depression or thoughts of suicide. Also watch out for sudden changes in feelings such as feeling anxious, agitated, panicky, irritable, hostile, aggressive, impulsive, severely restless, overly excited and hyperactive, or not being able to sleep. If this happens, especially at the beginning of treatment or after a change in dose, call your health care professional. Contact your doctor or health care professional right away if you are a man with an erection that lasts longer than 4 hours or if the erection becomes painful. This may be a sign of serious problem and must be treated right away to prevent permanent damage. What side effects may I notice from receiving this medicine? Side effects that you should report to your doctor or health care professional as soon as possible: -allergic reactions like skin rash, itching or hives, swelling of the face, lips, or tongue -abnormal production of milk -breast enlargement in both males and females -breathing problems -changes in emotions or moods -changes in vision -confusion -erection lasting more than 4  hours or a painful erection -fast or irregular heart beat -feeling faint or lightheaded, falls -fever or chills, sore throat -hallucination, loss of contact with reality -high or low blood pressure -menstrual changes -restlessness -slow or irregular heartbeat -stiff muscles -sweating -suicidal thoughts or other mood changes -swelling of the ankles, feet, hands -unusually weak or tired -vomiting Side effects that usually do not require medical attention (Report these to your doctor or health care professional if they continue or are bothersome.): -back pain -constipation -cough -dry mouth -nausea -tiredness This list may not describe all possible side effects. Call your doctor for medical advice about side effects. You may report side effects to FDA at 1-800-FDA-1088. Where should I keep my medicine? Keep out of the reach of children. This medicine can be abused. Keep your medicine in a safe place to protect it from theft. Do not share this medicine with anyone. Selling or giving away this medicine is dangerous and against the law. Store at room temperature between 15 and 30 degrees C (59 and 86 degrees F). Throw away any unused medicine after the expiration date. NOTE: This sheet is a summary. It may not cover all possible information. If you have questions about this medicine, talk to your doctor, pharmacist, or health care provider.    2016, Elsevier/Gold Standard. (2015-06-03 16:21:05)

## 2016-07-30 ENCOUNTER — Ambulatory Visit: Payer: 59 | Admitting: Physician Assistant

## 2016-11-20 ENCOUNTER — Encounter: Payer: Self-pay | Admitting: Physician Assistant

## 2016-11-20 ENCOUNTER — Ambulatory Visit (INDEPENDENT_AMBULATORY_CARE_PROVIDER_SITE_OTHER): Payer: 59 | Admitting: Physician Assistant

## 2016-11-20 VITALS — BP 120/82 | HR 68 | Temp 98.4°F | Resp 16 | Ht 67.0 in | Wt 233.0 lb

## 2016-11-20 DIAGNOSIS — Z713 Dietary counseling and surveillance: Secondary | ICD-10-CM | POA: Diagnosis not present

## 2016-11-20 DIAGNOSIS — E559 Vitamin D deficiency, unspecified: Secondary | ICD-10-CM

## 2016-11-20 DIAGNOSIS — E6609 Other obesity due to excess calories: Secondary | ICD-10-CM | POA: Diagnosis not present

## 2016-11-20 DIAGNOSIS — Z6836 Body mass index (BMI) 36.0-36.9, adult: Secondary | ICD-10-CM

## 2016-11-20 DIAGNOSIS — IMO0001 Reserved for inherently not codable concepts without codable children: Secondary | ICD-10-CM

## 2016-11-20 MED ORDER — PHENTERMINE HCL 15 MG PO CAPS: 15.0000 mg | ORAL_CAPSULE | ORAL | 0 refills | Status: DC

## 2016-11-20 NOTE — Progress Notes (Signed)
Patient: Amanda Ramsey Female    DOB: Aug 16, 1976   41 y.o.   MRN: [id number] Visit Date: 11/20/2016  Today's Provider: Margaretann Loveless, PA-C   Chief Complaint  Patient presents with  . Obesity   Subjective:    HPI  Follow up for weight loss  The patient was last seen for this 5 months ago. Changes made at last visit include started Belviq.  She reports poor compliance with treatment. She feels that condition is Unchanged. She is not having side effects. Patient did not start medication.  She has started trying to lose weight on her own using a food diary and restricted calories. She is exercising at work. She has a new part-time job cleaning houses and is exercising doing that for 2 hours daily.  ------------------------------------------------------------------------------------   Hypertension, follow-up:  BP Readings from Last 3 Encounters:  11/20/16 120/82  07/01/16 128/84  05/13/16 130/80    She was last seen for hypertension 6 months ago.  BP at that visit was 128/84. Management changes since that visit include mo changes. She reports excellent compliance with treatment. She is not having side effects.  She is not exercising. She is adherent to low salt diet.   Outside blood pressures are stable. She is experiencing none.  Patient denies chest pain and lower extremity edema.   Cardiovascular risk factors include diabetes mellitus and hypertension.  Use of agents associated with hypertension: none.     Weight trend: stable Wt Readings from Last 3 Encounters:  11/20/16 233 lb (105.7 kg)  07/01/16 235 lb 12.8 oz (107 kg)  05/13/16 230 lb 9.6 oz (104.6 kg)    Current diet: in general, a "healthy" diet    ------------------------------------------------------------------------     No Known Allergies   Current Outpatient Prescriptions:  .  Lorcaserin HCl (BELVIQ) 10 MG TABS, Take 10 mg by mouth daily., Disp: 30 tablet, Rfl: 0 .   metoprolol succinate (TOPROL XL) 100 MG 24 hr tablet, Take 1 tablet (100 mg total) by mouth daily., Disp: 30 tablet, Rfl: 11 .  triamterene-hydrochlorothiazide (MAXZIDE) 75-50 MG tablet, Take 1 tablet by mouth daily., Disp: 30 tablet, Rfl: 11 .  Vitamin D, Ergocalciferol, (DRISDOL) 50000 UNITS CAPS capsule, Take 1 capsule (50,000 Units total) by mouth every 30 (thirty) days., Disp: 12 capsule, Rfl: 0  Review of Systems  Constitutional: Negative.   Respiratory: Negative.   Cardiovascular: Negative.   Gastrointestinal: Negative.   Neurological: Negative.   Psychiatric/Behavioral: Negative.     Social History  Substance Use Topics  . Smoking status: Never Smoker  . Smokeless tobacco: Never Used  . Alcohol use 0.0 oz/week     Comment: occasionally   Objective:   BP 120/82 (BP Location: Left Arm, Patient Position: Sitting, Cuff Size: Large)   Pulse 68   Temp 98.4 F (36.9 C) (Oral)   Resp 16   Ht 5\' 7"  (1.702 m)   Wt 233 lb (105.7 kg)   SpO2 99%   BMI 36.49 kg/m   Physical Exam  Constitutional: She appears well-developed and well-nourished. No distress.  Neck: Normal range of motion. Neck supple.  Cardiovascular: Normal rate, regular rhythm and normal heart sounds.  Exam reveals no gallop and no friction rub.   No murmur heard. Pulmonary/Chest: Effort normal and breath sounds normal. No respiratory distress. She has no wheezes. She has no rales.  Musculoskeletal: She exhibits no edema.  Skin: She is not diaphoretic.  Vitals reviewed.  Assessment & Plan:     1. Encounter for weight loss counseling Will restart phentermine at the lowest dose to see if she will tolerate and make sure that it does not affect her BP. She is to continue exercise and try to add in a structured exercise program as well as a calorie restricted diet. I will see her back in 4 weeks to see how she is doing with her weight loss progress.  - phentermine 15 MG capsule; Take 1 capsule (15 mg total) by  mouth every morning.  Dispense: 30 capsule; Refill: 0  2. Class 2 obesity due to excess calories with serious comorbidity and body mass index (BMI) of 36.0 to 36.9 in adult See above medical treatment plan. - phentermine 15 MG capsule; Take 1 capsule (15 mg total) by mouth every morning.  Dispense: 30 capsule; Refill: 0  3. Avitaminosis D H/O this but not currently taking any supplementation. Will check labs as below and f/u pending results. - Vitamin D (25 hydroxy)       Margaretann LovelessJennifer M Burnette, PA-C  [facility]

## 2016-11-20 NOTE — Patient Instructions (Signed)

## 2016-11-21 LAB — VITAMIN D 25 HYDROXY (VIT D DEFICIENCY, FRACTURES): Vit D, 25-Hydroxy: 21.4 ng/mL — ABNORMAL LOW (ref 30.0–100.0)

## 2016-11-24 ENCOUNTER — Other Ambulatory Visit: Payer: Self-pay

## 2016-11-24 DIAGNOSIS — I10 Essential (primary) hypertension: Secondary | ICD-10-CM

## 2016-11-24 MED ORDER — TRIAMTERENE-HCTZ 75-50 MG PO TABS: 1.0000 | ORAL_TABLET | Freq: Every day | ORAL | 11 refills | Status: DC

## 2016-11-24 NOTE — Telephone Encounter (Signed)
Patient [facility]ills[facility]. Thanks!

## 2016-12-02 ENCOUNTER — Other Ambulatory Visit: Payer: Self-pay | Admitting: Physician Assistant

## 2016-12-02 DIAGNOSIS — I10 Essential (primary) hypertension: Secondary | ICD-10-CM

## 2016-12-18 ENCOUNTER — Encounter: Payer: 59 | Admitting: Physician Assistant

## 2016-12-21 ENCOUNTER — Encounter: Payer: Self-pay | Admitting: Physician Assistant

## 2016-12-21 ENCOUNTER — Ambulatory Visit (INDEPENDENT_AMBULATORY_CARE_PROVIDER_SITE_OTHER): Payer: 59 | Admitting: Physician Assistant

## 2016-12-21 VITALS — BP 110/70 | HR 82 | Temp 98.4°F | Resp 16 | Ht 67.0 in | Wt 237.6 lb

## 2016-12-21 DIAGNOSIS — E6609 Other obesity due to excess calories: Secondary | ICD-10-CM

## 2016-12-21 DIAGNOSIS — R7303 Prediabetes: Secondary | ICD-10-CM

## 2016-12-21 DIAGNOSIS — Z114 Encounter for screening for human immunodeficiency virus [HIV]: Secondary | ICD-10-CM | POA: Diagnosis not present

## 2016-12-21 DIAGNOSIS — Z1231 Encounter for screening mammogram for malignant neoplasm of breast: Secondary | ICD-10-CM | POA: Diagnosis not present

## 2016-12-21 DIAGNOSIS — R635 Abnormal weight gain: Secondary | ICD-10-CM

## 2016-12-21 DIAGNOSIS — Z1239 Encounter for other screening for malignant neoplasm of breast: Secondary | ICD-10-CM

## 2016-12-21 DIAGNOSIS — E559 Vitamin D deficiency, unspecified: Secondary | ICD-10-CM

## 2016-12-21 DIAGNOSIS — I10 Essential (primary) hypertension: Secondary | ICD-10-CM

## 2016-12-21 DIAGNOSIS — Z6837 Body mass index (BMI) 37.0-37.9, adult: Secondary | ICD-10-CM

## 2016-12-21 DIAGNOSIS — E7800 Pure hypercholesterolemia, unspecified: Secondary | ICD-10-CM | POA: Diagnosis not present

## 2016-12-21 DIAGNOSIS — Z0000 Encounter for general adult medical examination without abnormal findings: Secondary | ICD-10-CM

## 2016-12-21 DIAGNOSIS — IMO0001 Reserved for inherently not codable concepts without codable children: Secondary | ICD-10-CM

## 2016-12-21 DIAGNOSIS — Z1283 Encounter for screening for malignant neoplasm of skin: Secondary | ICD-10-CM

## 2016-12-21 MED ORDER — PHENTERMINE HCL 37.5 MG PO TABS: 37.5000 mg | ORAL_TABLET | Freq: Every day | ORAL | 0 refills | Status: DC

## 2016-12-21 NOTE — Progress Notes (Signed)
Patient: Amanda Ramsey, Female    DOB: 1976-10-11, 41 y.o.   MRN: [id number] Visit Date: 12/21/2016  Today's Provider: Margaretann LovelessJennifer M Burnette, PA-C   Chief Complaint  Patient presents with  . Annual Exam   Subjective:    Annual physical exam Amanda Ramsey is a 41 y.o. female who presents today for health maintenance and complete physical. She feels well. She reports exercising none. She reports she is sleeping fairly well.  Last CPE: 07/26/15 Pap: 05/25/14-Negative; Patient has had partial hysterectomy. No abnormal paps. Patient reports ovaries and cervix remain intact. Mammogram: 11/28/15 BI-RADS 1 -----------------------------------------------------------------  Review of Systems  Constitutional: Negative.   HENT: Negative.   Eyes: Negative.   Respiratory: Negative.   Cardiovascular: Negative.   Gastrointestinal: Negative.   Endocrine: Negative.   Genitourinary: Negative.   Musculoskeletal: Negative.   Skin: Negative.   Allergic/Immunologic: Negative.   Neurological: Negative.   Hematological: Negative.   Psychiatric/Behavioral: Negative.     Social History      She  reports that she has never smoked. She has never used smokeless tobacco. She reports that she drinks alcohol. She reports that she does not use drugs.       Social History   Social History  . Marital status: Single    Spouse name: N/A  . Number of children: N/A  . Years of education: N/A   Social History Main Topics  . Smoking status: Never Smoker  . Smokeless tobacco: Never Used  . Alcohol use 0.0 oz/week     Comment: occasionally  . Drug use: No  . Sexual activity: Not Asked   Other Topics Concern  . None   Social History Narrative  . None    History reviewed. No pertinent past medical history.   Patient Active Problem List   Diagnosis Date Noted  . Obesity 04/08/2016  . BMI 36.0-36.9,adult 04/08/2016  . Combined fat and carbohydrate induced hyperlipemia 07/26/2015    . Dizziness and giddiness 04/25/2015  . Accumulation of fluid in tissues 04/25/2015  . Gastritis, Helicobacter pylori 04/25/2015  . Decreased potassium in the blood 04/25/2015  . Borderline diabetes 04/25/2015  . Avitaminosis D 04/25/2015  . Abnormal ECG 06/01/2014  . Chest pain 06/01/2014  . Irregular bleeding 03/18/2009  . Adiposity 09/28/2003  . Essential (primary) hypertension 11/17/2001  . Hypercholesterolemia without hypertriglyceridemia 11/29/2000  . Family history of cardiovascular disease 11/09/1998    Past Surgical History:  Procedure Laterality Date  . ABDOMINAL HYSTERECTOMY  2011  . CHOLECYSTECTOMY      Family History        Family Status  Relation Status  . Mother Alive  . Father Deceased  . Sister Alive  . Neg Hx         Her family history includes Alcohol abuse in her father; Cirrhosis in her father; Hypertension in her mother and sister; Seizures in her father.     No Known Allergies   Current Outpatient Prescriptions:  .  metoprolol succinate (TOPROL XL) 100 MG 24 hr tablet, Take 1 tablet (100 mg total) by mouth daily., Disp: 30 tablet, Rfl: 11 .  phentermine 15 MG capsule, Take 1 capsule (15 mg total) by mouth every morning., Disp: 30 capsule, Rfl: 0 .  triamterene-hydrochlorothiazide (MAXZIDE) 75-50 MG tablet, TAKE 1 TABLET EVERY DAY, Disp: 30 tablet, Rfl: 1   Patient Care Team: Margaretann LovelessJennifer M Burnette, PA-C as PCP - General (Physician Assistant)      Objective:  Vitals: BP 110/70 (BP Location: Right Arm, Patient Position: Sitting, Cuff Size: Normal)   Pulse 82   Temp 98.4 F (36.9 C) (Oral)   Resp 16   Ht 5\' 7"  (1.702 m)   Wt 237 lb 9.6 oz (107.8 kg)   BMI 37.21 kg/m    Physical Exam  Constitutional: She is oriented to person, place, and time. She appears well-developed and well-nourished. No distress.  HENT:  Head: Normocephalic and atraumatic.  Right Ear: Hearing, tympanic membrane, external ear and ear canal normal.  Left Ear:  Hearing, tympanic membrane, external ear and ear canal normal.  Nose: Nose normal.  Mouth/Throat: Uvula is midline, oropharynx is clear and moist and mucous membranes are normal. No oropharyngeal exudate.  Eyes: Conjunctivae and EOM are normal. Pupils are equal, round, and reactive to light. Right eye exhibits no discharge. Left eye exhibits no discharge. No scleral icterus.  Neck: Normal range of motion. Neck supple. No JVD present. No tracheal deviation present. No thyromegaly present.  Cardiovascular: Normal rate, regular rhythm, normal heart sounds and intact distal pulses.  Exam reveals no gallop and no friction rub.   No murmur heard. Pulmonary/Chest: Effort normal and breath sounds normal. No respiratory distress. She has no wheezes. She has no rales. She exhibits no tenderness. Right breast exhibits no inverted nipple, no mass, no nipple discharge, no skin change and no tenderness. Left breast exhibits no inverted nipple, no mass, no nipple discharge, no skin change and no tenderness. Breasts are symmetrical.  Abdominal: Soft. Bowel sounds are normal. She exhibits no distension and no mass. There is no tenderness. There is no rebound and no guarding.  Musculoskeletal: Normal range of motion. She exhibits no edema or tenderness.  Lymphadenopathy:    She has no cervical adenopathy.  Neurological: She is alert and oriented to person, place, and time.  Skin: Skin is warm and dry. No rash noted. She is not diaphoretic.  Psychiatric: She has a normal mood and affect. Her behavior is normal. Judgment and thought content normal.  Vitals reviewed.   Depression Screen No flowsheet data found.   Assessment & Plan:     Routine Health Maintenance and Physical Exam  Exercise Activities and Dietary recommendations Goals    None      Immunization History  Administered Date(s) Administered  . Td 03/18/2005  . Tdap 09/24/2011    Health Maintenance  Topic Date Due  . PAP SMEAR   07/08/1996  . INFLUENZA VACCINE  08/20/2017 (Originally 06/09/2016)  . TETANUS/TDAP  09/23/2021  . HIV Screening  Completed     Discussed health benefits of physical activity, and encouraged her to engage in regular exercise appropriate for her age and condition.    1. Annual physical exam Normal physical exam today. Will check labs as below and f/u pending lab results. If labs are stable and WNL she will not need to have these rechecked for one year at her next annual physical exam. She is to call the office in the meantime if she has any acute issue, questions or concerns. - CBC with Differential/Platelet - Comprehensive metabolic panel - TSH  2. Breast cancer screening Breast exam today was normal. There is no family history of breast cancer. She does perform regular self breast exams. Mammogram was ordered as below. Information for [facility] was given to patient so she may schedule her mammogram at her convenience. - MM DIGITAL SCREENING BILATERAL; Future  3. Hypercholesterolemia without hypertriglyceridemia Will check labs as below  and f/u pending results. - Lipid panel  4. Borderline diabetes Will check labs as below and f/u pending results. - Comprehensive metabolic panel - Hemoglobin A1c  5. Essential (primary) hypertension Stable. Continue Metoprolol and Maxzide. Will check labs as below and f/u pending results. - CBC with Differential/Platelet - Comprehensive metabolic panel  6. Encounter for screening for malignant neoplasm of skin Patient has some moles that she is wanting to have checked and establish with a dermatologist for annual skin checks.  - Ambulatory referral to Dermatology  7. Class 2 obesity due to excess calories with serious comorbidity and body mass index (BMI) of 37.0 to 37.9 in adult Patient tolerated the phentermine 15mg  this time but did not notice any appetite suppression benefits. Will increase phentermine to 37.5mg  and see her back in  4 weeks. Encouraged to really try limiting portion sizes, encouraged food diary and exercise.  - phentermine (ADIPEX-P) 37.5 MG tablet; Take 1 tablet (37.5 mg total) by mouth daily before breakfast.  Dispense: 30 tablet; Refill: 0  8. Avitaminosis D H/O low Vit d and on Rx supplementation dose of 50,000IU weekly. Will check labs as below and f/u pending results. - Vitamin D (25 hydroxy)  9. Encounter for screening for HIV - HIV antibody (with reflex)  10. Weight gain Will check labs as below and f/u pending results. - B12  --------------------------------------------------------------------    Margaretann Loveless, PA-C  [facility]

## 2016-12-21 NOTE — Patient Instructions (Signed)

## 2016-12-22 ENCOUNTER — Telehealth: Payer: Self-pay

## 2016-12-22 LAB — VITAMIN D 25 HYDROXY (VIT D DEFICIENCY, FRACTURES): VIT D 25 HYDROXY: 16.9 ng/mL — AB (ref 30.0–100.0)

## 2016-12-22 LAB — COMPREHENSIVE METABOLIC PANEL
ALBUMIN: 4.2 g/dL (ref 3.5–5.5)
ALK PHOS: 36 IU/L — AB (ref 39–117)
ALT: 19 IU/L (ref 0–32)
AST: 13 IU/L (ref 0–40)
Albumin/Globulin Ratio: 1.1 — ABNORMAL LOW (ref 1.2–2.2)
BUN/Creatinine Ratio: 12 (ref 9–23)
BUN: 13 mg/dL (ref 6–24)
Bilirubin Total: 0.6 mg/dL (ref 0.0–1.2)
CO2: 24 mmol/L (ref 18–29)
CREATININE: 1.05 mg/dL — AB (ref 0.57–1.00)
Calcium: 9.1 mg/dL (ref 8.7–10.2)
Chloride: 101 mmol/L (ref 96–106)
GFR calc Af Amer: 76 mL/min/{1.73_m2} (ref >59)
GFR calc non Af Amer: 66 mL/min/{1.73_m2} (ref >59)
GLUCOSE: 96 mg/dL (ref 65–99)
Globulin, Total: 3.9 g/dL (ref 1.5–4.5)
Potassium: 4 mmol/L (ref 3.5–5.2)
Sodium: 141 mmol/L (ref 134–144)
Total Protein: 8.1 g/dL (ref 6.0–8.5)

## 2016-12-22 LAB — LIPID PANEL
CHOLESTEROL TOTAL: 201 mg/dL — AB (ref 100–199)
Chol/HDL Ratio: 3.7 ratio units (ref 0.0–4.4)
HDL: 55 mg/dL (ref >39)
LDL Calculated: 134 mg/dL — ABNORMAL HIGH (ref 0–99)
TRIGLYCERIDES: 62 mg/dL (ref 0–149)
VLDL CHOLESTEROL CAL: 12 mg/dL (ref 5–40)

## 2016-12-22 LAB — CBC WITH DIFFERENTIAL/PLATELET
BASOS ABS: 0 10*3/uL (ref 0.0–0.2)
Basos: 1 %
EOS (ABSOLUTE): 0.1 10*3/uL (ref 0.0–0.4)
Eos: 2 %
HEMOGLOBIN: 12.6 g/dL (ref 11.1–15.9)
Hematocrit: 39 % (ref 34.0–46.6)
Immature Grans (Abs): 0 10*3/uL (ref 0.0–0.1)
Immature Granulocytes: 0 %
LYMPHS ABS: 2 10*3/uL (ref 0.7–3.1)
Lymphs: 39 %
MCH: 25.3 pg — ABNORMAL LOW (ref 26.6–33.0)
MCHC: 32.3 g/dL (ref 31.5–35.7)
MCV: 78 fL — ABNORMAL LOW (ref 79–97)
MONOCYTES: 6 %
Monocytes Absolute: 0.3 10*3/uL (ref 0.1–0.9)
Neutrophils Absolute: 2.7 10*3/uL (ref 1.4–7.0)
Neutrophils: 52 %
Platelets: 343 10*3/uL (ref 150–379)
RBC: 4.99 x10E6/uL (ref 3.77–5.28)
RDW: 14.5 % (ref 12.3–15.4)
WBC: 5.2 10*3/uL (ref 3.4–10.8)

## 2016-12-22 LAB — HIV ANTIBODY (ROUTINE TESTING W REFLEX): HIV SCREEN 4TH GENERATION: NONREACTIVE

## 2016-12-22 LAB — VITAMIN B12: Vitamin B-12: 849 pg/mL (ref 232–1245)

## 2016-12-22 LAB — TSH: TSH: 2.75 u[IU]/mL (ref 0.450–4.500)

## 2016-12-22 LAB — HEMOGLOBIN A1C
Est. average glucose Bld gHb Est-mCnc: 123 mg/dL
Hgb A1c MFr Bld: 5.9 % — ABNORMAL HIGH (ref 4.8–5.6)

## 2016-12-22 MED ORDER — VITAMIN D (ERGOCALCIFEROL) 1.25 MG (50000 UNIT) PO CAPS: 50000.0000 [IU] | ORAL_CAPSULE | ORAL | 3 refills | Status: DC

## 2016-12-22 NOTE — Telephone Encounter (Signed)
Patient advised as directed below.  Thanks,  -Datha Kissinger 

## 2016-12-22 NOTE — Telephone Encounter (Signed)
-----   Message from Margaretann LovelessJennifer M Burnette, PA-C sent at 12/22/2016 10:17 AM EST ----- Cholesterol stable, A1c fairly improved to 5.9 from 6.0. B12 normal. Tsh normal. Vit D low. Continue vit D 50,000IU daily. Rx sent in.

## 2016-12-22 NOTE — Addendum Note (Signed)
Addended by: Margaretann LovelessBURNETTE, Gita Dilger M on: 12/22/2016 10:17 AM   Modules accepted: Orders

## 2016-12-25 ENCOUNTER — Telehealth: Payer: Self-pay

## 2016-12-25 ENCOUNTER — Ambulatory Visit
Admission: RE | Admit: 2016-12-25 | Discharge: 2016-12-25 | Disposition: A | Discharge status: Home / Self Care | Payer: 59 | Source: Ambulatory Visit | Attending: Physician Assistant | Admitting: Physician Assistant

## 2016-12-25 DIAGNOSIS — Z1239 Encounter for other screening for malignant neoplasm of breast: Secondary | ICD-10-CM

## 2016-12-25 DIAGNOSIS — Z1231 Encounter for screening mammogram for malignant neoplasm of breast: Secondary | ICD-10-CM | POA: Diagnosis not present

## 2016-12-25 NOTE — Telephone Encounter (Signed)
-----   Message from Margaretann LovelessJennifer M Burnette, [place]-C sent at 12/25/2016  4:17 PM EST ----- Normal mammogram. Repeat screening in one year.

## 2016-12-25 NOTE — Telephone Encounter (Signed)
Patient was advised. KW 

## 2017-01-18 ENCOUNTER — Ambulatory Visit (INDEPENDENT_AMBULATORY_CARE_PROVIDER_SITE_OTHER): Payer: 59 | Admitting: Physician Assistant

## 2017-01-18 ENCOUNTER — Ambulatory Visit: Payer: 59 | Admitting: Physician Assistant

## 2017-01-18 ENCOUNTER — Encounter: Payer: Self-pay | Admitting: Physician Assistant

## 2017-01-18 VITALS — BP 130/70 | HR 78 | Temp 98.2°F | Resp 16 | Wt 231.8 lb

## 2017-01-18 DIAGNOSIS — Z6837 Body mass index (BMI) 37.0-37.9, adult: Secondary | ICD-10-CM

## 2017-01-18 DIAGNOSIS — E6609 Other obesity due to excess calories: Secondary | ICD-10-CM | POA: Diagnosis not present

## 2017-01-18 DIAGNOSIS — IMO0001 Reserved for inherently not codable concepts without codable children: Secondary | ICD-10-CM

## 2017-01-18 MED ORDER — PHENTERMINE HCL 37.5 MG PO TABS: 37.5000 mg | ORAL_TABLET | Freq: Every day | ORAL | 2 refills | Status: DC

## 2017-01-18 NOTE — Progress Notes (Signed)
       Patient: Amanda RaringRasheeda A Nottingham Female    DOB: 1976/01/24   41 y.o.   MRN: [id number] Visit Date: 01/18/2017  Today's Provider: Margaretann LovelessJennifer M Burnette, PA-C   Chief Complaint  Patient presents with  . Follow-up    Obesity   Subjective:    HPI Patient is here to follow-up on Obesity. Phentermine was increased to 37.5mg . She is exercising. She is not counting calories. She has lost a total of 7 pounds over the last 4 weeks since starting. She is tolerating the medication much better this time.      No Known Allergies   Current Outpatient Prescriptions:  .  metoprolol succinate (TOPROL XL) 100 MG 24 hr tablet, Take 1 tablet (100 mg total) by mouth daily., Disp: 30 tablet, Rfl: 11 .  phentermine (ADIPEX-P) 37.5 MG tablet, Take 1 tablet (37.5 mg total) by mouth daily before breakfast., Disp: 30 tablet, Rfl: 0 .  triamterene-hydrochlorothiazide (MAXZIDE) 75-50 MG tablet, TAKE 1 TABLET EVERY DAY, Disp: 30 tablet, Rfl: 1 .  Vitamin D, Ergocalciferol, (DRISDOL) 50000 units CAPS capsule, Take 1 capsule (50,000 Units total) by mouth every 7 (seven) days., Disp: 12 capsule, Rfl: 3  Review of Systems  Constitutional: Negative.   Respiratory: Negative.   Cardiovascular: Negative.   Gastrointestinal: Negative.   Neurological: Negative.   Psychiatric/Behavioral: Negative.     Social History  Substance Use Topics  . Smoking status: Never Smoker  . Smokeless tobacco: Never Used  . Alcohol use 0.0 oz/week     Comment: occasionally   Objective:   BP 130/70 (BP Location: Right Arm, Patient Position: Sitting, Cuff Size: Normal)   Pulse 78   Temp 98.2 F (36.8 C) (Oral)   Resp 16   Wt 231 lb 12.8 oz (105.1 kg)   BMI 36.31 kg/m     Physical Exam  Constitutional: She appears well-developed and well-nourished. No distress.  Neck: Normal range of motion. Neck supple.  Cardiovascular: Normal rate, regular rhythm and normal heart sounds.  Exam reveals no gallop and no friction rub.   No  murmur heard. Pulmonary/Chest: Effort normal and breath sounds normal. No respiratory distress. She has no wheezes. She has no rales.  Musculoskeletal: She exhibits no edema.  Skin: She is not diaphoretic.  Psychiatric: She has a normal mood and affect. Her behavior is normal. Judgment and thought content normal.  Vitals reviewed.     Assessment & Plan:     1. Class 2 obesity due to excess calories with serious comorbidity and body mass index (BMI) of 37.0 to 37.9 in adult Doing well and has lost 7 pounds. Tolerating well. Will continue for 3 months. I will see her back at that time to check her progress. She is to call if she develops adverse reactions.  - phentermine (ADIPEX-P) 37.5 MG tablet; Take 1 tablet (37.5 mg total) by mouth daily before breakfast.  Dispense: 30 tablet; Refill: 2       Margaretann LovelessJennifer M Burnette, PA-C  [facility]

## 2017-01-18 NOTE — Patient Instructions (Signed)

## 2017-04-06 ENCOUNTER — Other Ambulatory Visit: Payer: Self-pay | Admitting: Physician Assistant

## 2017-04-06 DIAGNOSIS — I10 Essential (primary) hypertension: Secondary | ICD-10-CM

## 2017-04-06 MED ORDER — METOPROLOL SUCCINATE ER 100 MG PO TB24: 100.0000 mg | ORAL_TABLET | Freq: Every day | ORAL | 1 refills | Status: DC

## 2017-04-06 NOTE — Telephone Encounter (Signed)
[facility] faxed a request for a 90-days supply on the following medication.  metoprolol succinate (TOPROL XL) 100 MG 24 hr tablet  Take 1 tablet every day.

## 2017-04-06 NOTE — Telephone Encounter (Signed)
LOV for HTN 12/21/2016, and was stable at that time. Allene DillonEmily Drozdowski, CMA

## 2017-04-21 ENCOUNTER — Encounter: Payer: Self-pay | Admitting: Physician Assistant

## 2017-04-21 ENCOUNTER — Ambulatory Visit (INDEPENDENT_AMBULATORY_CARE_PROVIDER_SITE_OTHER): Payer: 59 | Admitting: Physician Assistant

## 2017-04-21 VITALS — BP 106/80 | HR 46 | Temp 98.7°F | Resp 16 | Ht 67.0 in | Wt 230.8 lb

## 2017-04-21 DIAGNOSIS — R5383 Other fatigue: Secondary | ICD-10-CM | POA: Diagnosis not present

## 2017-04-21 DIAGNOSIS — I10 Essential (primary) hypertension: Secondary | ICD-10-CM

## 2017-04-21 DIAGNOSIS — Z6837 Body mass index (BMI) 37.0-37.9, adult: Secondary | ICD-10-CM

## 2017-04-21 DIAGNOSIS — R001 Bradycardia, unspecified: Secondary | ICD-10-CM | POA: Diagnosis not present

## 2017-04-21 MED ORDER — METOPROLOL SUCCINATE ER 50 MG PO TB24: 50.0000 mg | ORAL_TABLET | Freq: Every day | ORAL | 0 refills | Status: DC

## 2017-04-21 NOTE — Patient Instructions (Signed)
Bradycardia, Adult °Bradycardia is a slower-than-normal heartbeat. A normal resting heart rate for an adult ranges from 60 to 100 beats per minute. With bradycardia, the resting heart rate is less than 60 beats per minute. °Bradycardia can prevent enough oxygen from reaching certain areas of your body when you are active. It can be serious if it keeps enough oxygen from reaching your brain and other parts of your body. Bradycardia is not a problem for everyone. For some healthy adults, a slow resting heart rate is normal. °What are the causes? °This condition may be caused by: °· A problem with the heart, including: °? A problem with the heart's electrical system, such as a heart block. °? A problem with the heart's natural pacemaker (sinus node). °? Heart disease. °? A heart attack. °? Heart damage. °? A heart infection. °? A heart condition that is present at birth (congenital heart defect). °· Certain medicines that treat heart conditions. °· Certain conditions, such as hypothyroidism and obstructive sleep apnea. °· Problems with the balance of chemicals and other substances, like potassium, in the blood. ° °What increases the risk? °This condition is more likely to develop in adults who: °· Are age 65 or older. °· Have high blood pressure (hypertension), high cholesterol (hyperlipidemia), or diabetes. °· Drink heavily, use tobacco or nicotine products, or use drugs. °· Are stressed. ° °What are the signs or symptoms? °Symptoms of this condition include: °· Light-headedness. °· Feeling faint or fainting. °· Fatigue and weakness. °· Shortness of breath. °· Chest pain (angina). °· Drowsiness. °· Confusion. °· Dizziness. ° °How is this diagnosed? °This condition may be diagnosed based on: °· Your symptoms. °· Your medical history. °· A physical exam. ° °During the exam, your health care provider will listen to your heartbeat and check your pulse. To confirm the diagnosis, your health care provider may order tests,  such as: °· Blood tests. °· An electrocardiogram (ECG). This test records the heart's electrical activity. The test can show how fast your heart is beating and whether the heartbeat is steady. °· A test in which you wear a portable device (event recorder or Holter monitor) to record your heart's electrical activity while you go about your day. °· An exercise test. ° °How is this treated? °Treatment for this condition depends on the cause of the condition and how severe your symptoms are. Treatment may involve: °· Treatment of the underlying condition. °· Changing your medicines or how much medicine you take. °· Having a small, battery-operated device called a pacemaker implanted under the skin. When bradycardia occurs, this device can be used to increase your heart rate and help your heart to beat in a regular rhythm. ° °Follow these instructions at home: °Lifestyle ° °· Manage any health conditions that contribute to bradycardia as told by your health care provider. °· Follow a heart-healthy diet. A nutrition specialist (dietitian) can help to educate you about healthy food options and changes. °· Follow an exercise program that is approved by your health care provider. °· Maintain a healthy weight. °· Try to reduce or manage your stress, such as with yoga or meditation. If you need help reducing stress, ask your health care provider. °· Do not use use any products that contain nicotine or tobacco, such as cigarettes and e-cigarettes. If you need help quitting, ask your health care provider. °· Do not use illegal drugs. °· Limit alcohol intake to no more than 1 drink per day for nonpregnant women and 2 drinks per   day for men. One drink equals 12 oz of beer, 5 oz of wine, or 1½ oz of hard liquor. °General instructions °· Take over-the-counter and prescription medicines only as told by your health care provider. °· Keep all follow-up visits as directed by your health care provider. This is important. °How is this  prevented? °In some cases, bradycardia may be prevented by: °· Treating underlying medical problems. °· Stopping behaviors or medicines that can trigger the condition. ° °Contact a health care provider if: °· You feel light-headed or dizzy. °· You almost faint. °· You feel weak or are easily fatigued during physical activity. °· You experience confusion or have memory problems. °Get help right away if: °· You faint. °· You have an irregular heartbeat (palpitations). °· You have chest pain. °· You have trouble breathing. °This information is not intended to replace advice given to you by your health care provider. Make sure you discuss any questions you have with your health care provider. °Document Released: 07/18/2002 Document Revised: 06/23/2016 Document Reviewed: 04/16/2016 °Elsevier Interactive Patient Education © 2017 Elsevier Inc. ° °

## 2017-04-21 NOTE — Progress Notes (Signed)
Patient: Amanda Ramsey Female    DOB: [birth year]   41 y.o.   MRN: [id number] Visit Date: 04/21/2017  Today's Provider: Margaretann LovelessJennifer M Burnette, PA-C   Chief Complaint  Patient presents with  . Follow-up   Subjective:    HPI  Follow up for weight loss  The patient was last seen for this 4 weeks ago. Changes made at last visit include no changes.  She reports excellent compliance with treatment. She feels that condition is Unchanged. She is having side effects. Patient denies any new side effects.  Wt Readings from Last 3 Encounters:  04/21/17 230 lb 12.8 oz (104.7 kg)  01/18/17 231 lb 12.8 oz (105.1 kg)  12/21/16 237 lb 9.6 oz (107.8 kg)   Current Exercise Habits: The patient does not participate in regular exercise at present   ------------------------------------------------------------------------------------ Patient is having some increased fatigue and feeling "down" that started over the last 2 weeks. She does not know if this is a new side effect of the phentermine or something else. After taking her VS she was found to have a HR in the mid 40s, normally in the 60s to 70s. She is currently on metoprolol.       No Known Allergies   Current Outpatient Prescriptions:  .  metoprolol succinate (TOPROL XL) 100 MG 24 hr tablet, Take 1 tablet (100 mg total) by mouth daily., Disp: 90 tablet, Rfl: 1 .  phentermine (ADIPEX-P) 37.5 MG tablet, Take 1 tablet (37.5 mg total) by mouth daily before breakfast., Disp: 30 tablet, Rfl: 2 .  triamterene-hydrochlorothiazide (MAXZIDE) 75-50 MG tablet, TAKE 1 TABLET EVERY DAY, Disp: 30 tablet, Rfl: 1 .  Vitamin D, Ergocalciferol, (DRISDOL) 50000 units CAPS capsule, Take 1 capsule (50,000 Units total) by mouth every 7 (seven) days., Disp: 12 capsule, Rfl: 3  Review of Systems  Constitutional: Positive for fatigue.  Respiratory: Negative.   Cardiovascular: Negative.   Gastrointestinal: Negative.   Neurological: Positive for  headaches. Negative for dizziness, syncope and light-headedness.  Psychiatric/Behavioral: Positive for dysphoric mood. Negative for agitation, confusion, decreased concentration, self-injury, sleep disturbance and suicidal ideas. The patient is nervous/anxious.     Social History  Substance Use Topics  . Smoking status: Never Smoker  . Smokeless tobacco: Never Used  . Alcohol use 0.0 oz/week     Comment: occasionally   Objective:   BP 106/80 (BP Location: Left Arm, Patient Position: Sitting, Cuff Size: Large)   Pulse (!) 46   Temp 98.7 F (37.1 C) (Oral)   Resp 16   Ht 5\' 7"  (1.702 m)   Wt 230 lb 12.8 oz (104.7 kg)   SpO2 99%   BMI 36.15 kg/m  Vitals:   04/21/17 1632  BP: 106/80  Pulse: (!) 46  Resp: 16  Temp: 98.7 F (37.1 C)  TempSrc: Oral  SpO2: 99%  Weight: 230 lb 12.8 oz (104.7 kg)  Height: 5\' 7"  (1.702 m)     Physical Exam  Constitutional: She appears well-developed and well-nourished. No distress.  Neck: Normal range of motion. Neck supple. No JVD present. No tracheal deviation present. No thyromegaly present.  Cardiovascular: Regular rhythm and normal heart sounds.  Bradycardia present.  Exam reveals no gallop and no friction rub.   No murmur heard. Pulmonary/Chest: Effort normal and breath sounds normal. No respiratory distress. She has no wheezes. She has no rales.  Lymphadenopathy:    She has no cervical adenopathy.  Skin: She is not diaphoretic.  Vitals reviewed.  Assessment & Plan:     1. Bradycardia EKG today shows NSR rate 46, no heart block or BBB noted. Negative T waves noted in V1-V3. No chest pain, SOB, DOE, orthopnea, PND. Suspect bradycardia from metoprolol. Will decrease dose to 50mg  from 100mg  and see her back in one week to recheck her BP and heart rate. She is to call if symptoms worsen in the meantime.  - EKG 12-Lead - metoprolol succinate (TOPROL-XL) 50 MG 24 hr tablet; Take 1 tablet (50 mg total) by mouth daily. Take with or  immediately following a meal.  Dispense: 30 tablet; Refill: 0  2. Fatigue, unspecified type See above medical treatment plan. - EKG 12-Lead - metoprolol succinate (TOPROL-XL) 50 MG 24 hr tablet; Take 1 tablet (50 mg total) by mouth daily. Take with or immediately following a meal.  Dispense: 30 tablet; Refill: 0  3. Class 2 severe obesity due to excess calories with serious comorbidity and body mass index (BMI) of 37.0 to 37.9 in adult [facility]) Patient is to hold phentermine until seen next week.   4. Essential hypertension See above medical treatment plan for #1.  - metoprolol succinate (TOPROL-XL) 50 MG 24 hr tablet; Take 1 tablet (50 mg total) by mouth daily. Take with or immediately following a meal.  Dispense: 30 tablet; Refill: 0       Margaretann Loveless, PA-C  [facility]

## 2017-04-28 ENCOUNTER — Encounter: Payer: Self-pay | Admitting: Physician Assistant

## 2017-04-28 ENCOUNTER — Ambulatory Visit (INDEPENDENT_AMBULATORY_CARE_PROVIDER_SITE_OTHER): Payer: 59 | Admitting: Physician Assistant

## 2017-04-28 VITALS — BP 130/90 | HR 56 | Temp 97.8°F | Resp 16 | Ht 67.0 in | Wt 235.0 lb

## 2017-04-28 DIAGNOSIS — I10 Essential (primary) hypertension: Secondary | ICD-10-CM

## 2017-04-28 DIAGNOSIS — R001 Bradycardia, unspecified: Secondary | ICD-10-CM | POA: Diagnosis not present

## 2017-04-28 MED ORDER — AMLODIPINE BESYLATE 5 MG PO TABS: 5.0000 mg | ORAL_TABLET | Freq: Every day | ORAL | 1 refills | Status: DC

## 2017-04-28 NOTE — Progress Notes (Signed)
       Patient: Amanda RaringRasheeda A Ramsey Female    DOB: 27-Mar-1976   41 y.o.   MRN: [id number] Visit Date: 04/28/2017  Today's Provider: Margaretann LovelessJennifer M Vyron Fronczak, PA-C   Chief Complaint  Patient presents with  . Follow-up   Subjective:    HPI Patient here today to follow up on bradycardia last OV was 04/21/17. Patient was advised to decrease metoprolol from 100 mg to 50 mg daily. Patient reports good tolerance and compliance with treatment. Patient denies any symptoms. She reports that her fatigue has improved by about 50% from last week. No chest pain or SOB.     No Known Allergies   Current Outpatient Prescriptions:  .  metoprolol succinate (TOPROL-XL) 50 MG 24 hr tablet, Take 1 tablet (50 mg total) by mouth daily. Take with or immediately following a meal., Disp: 30 tablet, Rfl: 0 .  phentermine (ADIPEX-P) 37.5 MG tablet, Take 1 tablet (37.5 mg total) by mouth daily before breakfast., Disp: 30 tablet, Rfl: 2 .  triamterene-hydrochlorothiazide (MAXZIDE) 75-50 MG tablet, TAKE 1 TABLET EVERY DAY, Disp: 30 tablet, Rfl: 1 .  Vitamin D, Ergocalciferol, (DRISDOL) 50000 units CAPS capsule, Take 1 capsule (50,000 Units total) by mouth every 7 (seven) days., Disp: 12 capsule, Rfl: 3  Review of Systems  Constitutional: Negative.   Respiratory: Negative.   Cardiovascular: Negative.   Gastrointestinal: Negative.   Neurological: Negative.     Social History  Substance Use Topics  . Smoking status: Never Smoker  . Smokeless tobacco: Never Used  . Alcohol use 0.0 oz/week     Comment: occasionally   Objective:   BP 130/90 (BP Location: Left Arm, Patient Position: Sitting, Cuff Size: Large)   Pulse (!) 56   Temp 97.8 F (36.6 C) (Oral)   Resp 16   Ht 5\' 7"  (1.702 m)   Wt 235 lb (106.6 kg)   SpO2 99%   BMI 36.81 kg/m  Vitals:   04/28/17 1550  BP: 130/90  Pulse: (!) 56  Resp: 16  Temp: 97.8 F (36.6 C)  TempSrc: Oral  SpO2: 99%  Weight: 235 lb (106.6 kg)  Height: 5\' 7"  (1.702 m)      Physical Exam  Constitutional: She appears well-developed and well-nourished. No distress.  Neck: Normal range of motion. Neck supple.  Cardiovascular: Regular rhythm and normal heart sounds.  Bradycardia present.  Exam reveals no gallop and no friction rub.   No murmur heard. Pulmonary/Chest: Effort normal and breath sounds normal. No respiratory distress. She has no wheezes. She has no rales.  Skin: She is not diaphoretic.  Vitals reviewed.      Assessment & Plan:     1. Essential hypertension I will stop her metoprolol due to bradycardia at this time and we will start amlodipine as below. She is to continue Triamterene-HCTZ 37.5-25mg  as well. I will see her back in 2-4 weeks to recheck her BP. She will also be due for her biometric screen through her employer at that time as well. She is to call the office if she develops any adverse reaction to the medication. - amLODipine (NORVASC) 5 MG tablet; Take 1 tablet (5 mg total) by mouth daily.  Dispense: 90 tablet; Refill: 1  2. Bradycardia See above medical treatment plan.       Margaretann LovelessJennifer M Loucinda Croy, PA-C  [facility]

## 2017-04-28 NOTE — Patient Instructions (Signed)
Amlodipine tablets °What is this medicine? °AMLODIPINE (am LOE di peen) is a calcium-channel blocker. It affects the amount of calcium found in your heart and muscle cells. This relaxes your blood vessels, which can reduce the amount of work the heart has to do. This medicine is used to lower high blood pressure. It is also used to prevent chest pain. °This medicine may be used for other purposes; ask your health care provider or pharmacist if you have questions. °COMMON BRAND NAME(S): Norvasc °What should I tell my health care provider before I take this medicine? °They need to know if you have any of these conditions: °-heart problems like heart failure or aortic stenosis °-liver disease °-an unusual or allergic reaction to amlodipine, other medicines, foods, dyes, or preservatives °-pregnant or trying to get pregnant °-breast-feeding °How should I use this medicine? °Take this medicine by mouth with a glass of water. Follow the directions on the prescription label. Take your medicine at regular intervals. Do not take more medicine than directed. °Talk to your pediatrician regarding the use of this medicine in children. Special care may be needed. This medicine has been used in children as young as 6. °Persons over 65 years old may have a stronger reaction to this medicine and need smaller doses. °Overdosage: If you think you have taken too much of this medicine contact a poison control center or emergency room at once. °NOTE: This medicine is only for you. Do not share this medicine with others. °What if I miss a dose? °If you miss a dose, take it as soon as you can. If it is almost time for your next dose, take only that dose. Do not take double or extra doses. °What may interact with this medicine? °-herbal or dietary supplements °-local or general anesthetics °-medicines for high blood pressure °-medicines for prostate problems °-rifampin °This list may not describe all possible interactions. Give your health  care provider a list of all the medicines, herbs, non-prescription drugs, or dietary supplements you use. Also tell them if you smoke, drink alcohol, or use illegal drugs. Some items may interact with your medicine. °What should I watch for while using this medicine? °Visit your doctor or health care professional for regular check ups. Check your blood pressure and pulse rate regularly. Ask your health care professional what your blood pressure and pulse rate should be, and when you should contact him or her. °This medicine may make you feel confused, dizzy or lightheaded. Do not drive, use machinery, or do anything that needs mental alertness until you know how this medicine affects you. To reduce the risk of dizzy or fainting spells, do not sit or stand up quickly, especially if you are an older patient. Avoid alcoholic drinks; they can make you more dizzy. °Do not suddenly stop taking amlodipine. Ask your doctor or health care professional how you can gradually reduce the dose. °What side effects may I notice from receiving this medicine? °Side effects that you should report to your doctor or health care professional as soon as possible: °-allergic reactions like skin rash, itching or hives, swelling of the face, lips, or tongue °-breathing problems °-changes in vision or hearing °-chest pain °-fast, irregular heartbeat °-swelling of legs or ankles °Side effects that usually do not require medical attention (report to your doctor or health care professional if they continue or are bothersome): °-dry mouth °-facial flushing °-nausea, vomiting °-stomach gas, pain °-tired, weak °-trouble sleeping °This list may not describe all possible side   effects. Call your doctor for medical advice about side effects. You may report side effects to FDA at 1-800-FDA-1088. °Where should I keep my medicine? °Keep out of the reach of children. °Store at room temperature between 59 and 86 degrees F (15 and 30 degrees C). Protect from  light. Keep container tightly closed. Throw away any unused medicine after the expiration date. °NOTE: This sheet is a summary. It may not cover all possible information. If you have questions about this medicine, talk to your doctor, pharmacist, or health care provider. °© 2018 Elsevier/[employer] Standard (2012-09-23 11:40:58) ° °

## 2017-05-20 ENCOUNTER — Ambulatory Visit (INDEPENDENT_AMBULATORY_CARE_PROVIDER_SITE_OTHER): Payer: 59 | Admitting: Physician Assistant

## 2017-05-20 ENCOUNTER — Encounter: Payer: Self-pay | Admitting: Physician Assistant

## 2017-05-20 VITALS — BP 140/92 | HR 116 | Temp 98.8°F | Resp 16 | Ht 67.0 in | Wt 228.0 lb

## 2017-05-20 DIAGNOSIS — E7800 Pure hypercholesterolemia, unspecified: Secondary | ICD-10-CM

## 2017-05-20 DIAGNOSIS — I10 Essential (primary) hypertension: Secondary | ICD-10-CM

## 2017-05-20 DIAGNOSIS — Z6835 Body mass index (BMI) 35.0-35.9, adult: Secondary | ICD-10-CM

## 2017-05-20 DIAGNOSIS — R7303 Prediabetes: Secondary | ICD-10-CM

## 2017-05-20 DIAGNOSIS — Z0189 Encounter for other specified special examinations: Secondary | ICD-10-CM | POA: Diagnosis not present

## 2017-05-20 DIAGNOSIS — Z008 Encounter for other general examination: Secondary | ICD-10-CM

## 2017-05-20 MED ORDER — AMLODIPINE BESYLATE 10 MG PO TABS: 10.0000 mg | ORAL_TABLET | Freq: Every day | ORAL | 1 refills | Status: DC

## 2017-05-20 NOTE — Patient Instructions (Signed)
DASH Eating Plan DASH stands for "Dietary Approaches to Stop Hypertension." The DASH eating plan is a healthy eating plan that has been shown to reduce high blood pressure (hypertension). It may also reduce your risk for type 2 diabetes, heart disease, and stroke. The DASH eating plan may also help with weight loss. What are tips for following this plan? General guidelines  Avoid eating more than 2,300 mg (milligrams) of salt (sodium) a day. If you have hypertension, you may need to reduce your sodium intake to 1,500 mg a day.  Limit alcohol intake to no more than 1 drink a day for nonpregnant women and 2 drinks a day for men. One drink equals 12 oz of beer, 5 oz of wine, or 1 oz of hard liquor.  Work with your health care provider to maintain a healthy body weight or to lose weight. Ask what an ideal weight is for you.  Get at least 30 minutes of exercise that causes your heart to beat faster (aerobic exercise) most days of the week. Activities may include walking, swimming, or biking.  Work with your health care provider or diet and nutrition specialist (dietitian) to adjust your eating plan to your individual calorie needs. Reading food labels  Check food labels for the amount of sodium per serving. Choose foods with less than 5 percent of the Daily Value of sodium. Generally, foods with less than 300 mg of sodium per serving fit into this eating plan.  To find whole grains, look for the word "whole" as the first word in the ingredient list. Shopping  Buy products labeled as "low-sodium" or "no salt added."  Buy fresh foods. Avoid canned foods and premade or frozen meals. Cooking  Avoid adding salt when cooking. Use salt-free seasonings or herbs instead of table salt or sea salt. Check with your health care provider or pharmacist before using salt substitutes.  Do not fry foods. Cook foods using healthy methods such as baking, boiling, grilling, and broiling instead.  Cook with  heart-healthy oils, such as olive, canola, soybean, or sunflower oil. Meal planning   Eat a balanced diet that includes: ? 5 or more servings of fruits and vegetables each day. At each meal, try to fill half of your plate with fruits and vegetables. ? Up to 6-8 servings of whole grains each day. ? Less than 6 oz of lean meat, poultry, or fish each day. A 3-oz serving of meat is about the same size as a deck of cards. One egg equals 1 oz. ? 2 servings of low-fat dairy each day. ? A serving of nuts, seeds, or beans 5 times each week. ? Heart-healthy fats. Healthy fats called Omega-3 fatty acids are found in foods such as flaxseeds and coldwater fish, like sardines, salmon, and mackerel.  Limit how much you eat of the following: ? Canned or prepackaged foods. ? Food that is high in trans fat, such as fried foods. ? Food that is high in saturated fat, such as fatty meat. ? Sweets, desserts, sugary drinks, and other foods with added sugar. ? Full-fat dairy products.  Do not salt foods before eating.  Try to eat at least 2 vegetarian meals each week.  Eat more home-cooked food and less restaurant, buffet, and fast food.  When eating at a restaurant, ask that your food be prepared with less salt or no salt, if possible. What foods are recommended? The items listed may not be a complete list. Talk with your dietitian about what   dietary choices are best for you. Grains Whole-grain or whole-wheat bread. Whole-grain or whole-wheat pasta. Brown rice. Oatmeal. Quinoa. Bulgur. Whole-grain and low-sodium cereals. Pita bread. Low-fat, low-sodium crackers. Whole-wheat flour tortillas. Vegetables Fresh or frozen vegetables (raw, steamed, roasted, or grilled). Low-sodium or reduced-sodium tomato and vegetable juice. Low-sodium or reduced-sodium tomato sauce and tomato paste. Low-sodium or reduced-sodium canned vegetables. Fruits All fresh, dried, or frozen fruit. Canned fruit in natural juice (without  added sugar). Meat and other protein foods Skinless chicken or turkey. Ground chicken or turkey. Pork with fat trimmed off. Fish and seafood. Egg whites. Dried beans, peas, or lentils. Unsalted nuts, nut butters, and seeds. Unsalted canned beans. Lean cuts of beef with fat trimmed off. Low-sodium, lean deli meat. Dairy Low-fat (1%) or fat-free (skim) milk. Fat-free, low-fat, or reduced-fat cheeses. Nonfat, low-sodium ricotta or cottage cheese. Low-fat or nonfat yogurt. Low-fat, low-sodium cheese. Fats and oils Soft margarine without trans fats. Vegetable oil. Low-fat, reduced-fat, or light mayonnaise and salad dressings (reduced-sodium). Canola, safflower, olive, soybean, and sunflower oils. Avocado. Seasoning and other foods Herbs. Spices. Seasoning mixes without salt. Unsalted popcorn and pretzels. Fat-free sweets. What foods are not recommended? The items listed may not be a complete list. Talk with your dietitian about what dietary choices are best for you. Grains Baked goods made with fat, such as croissants, muffins, or some breads. Dry pasta or rice meal packs. Vegetables Creamed or fried vegetables. Vegetables in a cheese sauce. Regular canned vegetables (not low-sodium or reduced-sodium). Regular canned tomato sauce and paste (not low-sodium or reduced-sodium). Regular tomato and vegetable juice (not low-sodium or reduced-sodium). Pickles. Olives. Fruits Canned fruit in a light or heavy syrup. Fried fruit. Fruit in cream or butter sauce. Meat and other protein foods Fatty cuts of meat. Ribs. Fried meat. Bacon. Sausage. Bologna and other processed lunch meats. Salami. Fatback. Hotdogs. Bratwurst. Salted nuts and seeds. Canned beans with added salt. Canned or smoked fish. Whole eggs or egg yolks. Chicken or turkey with skin. Dairy Whole or 2% milk, cream, and half-and-half. Whole or full-fat cream cheese. Whole-fat or sweetened yogurt. Full-fat cheese. Nondairy creamers. Whipped toppings.  Processed cheese and cheese spreads. Fats and oils Butter. Stick margarine. Lard. Shortening. Ghee. Bacon fat. Tropical oils, such as coconut, palm kernel, or palm oil. Seasoning and other foods Salted popcorn and pretzels. Onion salt, garlic salt, seasoned salt, table salt, and sea salt. Worcestershire sauce. Tartar sauce. Barbecue sauce. Teriyaki sauce. Soy sauce, including reduced-sodium. Steak sauce. Canned and packaged gravies. Fish sauce. Oyster sauce. Cocktail sauce. Horseradish that you find on the shelf. Ketchup. Mustard. Meat flavorings and tenderizers. Bouillon cubes. Hot sauce and Tabasco sauce. Premade or packaged marinades. Premade or packaged taco seasonings. Relishes. Regular salad dressings. Where to find more information:  National Heart, Lung, and Blood Institute: www.nhlbi.nih.gov  American Heart Association: www.heart.org Summary  The DASH eating plan is a healthy eating plan that has been shown to reduce high blood pressure (hypertension). It may also reduce your risk for type 2 diabetes, heart disease, and stroke.  With the DASH eating plan, you should limit salt (sodium) intake to 2,300 mg a day. If you have hypertension, you may need to reduce your sodium intake to 1,500 mg a day.  When on the DASH eating plan, aim to eat more fresh fruits and vegetables, whole grains, lean proteins, low-fat dairy, and heart-healthy fats.  Work with your health care provider or diet and nutrition specialist (dietitian) to adjust your eating plan to your individual   calorie needs. This information is not intended to replace advice given to you by your health care provider. Make sure you discuss any questions you have with your health care provider. Document Released: 10/15/2011 Document Revised: 10/19/2016 Document Reviewed: 10/19/2016 Elsevier Interactive Patient Education  2017 Elsevier Inc.  

## 2017-05-20 NOTE — Progress Notes (Signed)
Patient: Amanda Ramsey Female    DOB: June 12, 1976   41 y.o.   MRN: [id number] Visit Date: 05/20/2017  Today's Provider: Margaretann LovelessJennifer M Burnette, PA-C   Chief Complaint  Patient presents with  . Biometric Screening   Subjective:    HPI Patient here today for biometric screening. Waist Circumference is 43 1/2 inches.  She also recently started phentermine again for weight loss. This was only started this Monday, 05/17/17. She did not take today but did take Mon-Wed. Her blood pressure has also been "creeping up" per her at home readings. She did not bring with her, but states that yesterday her BP was 140s/100s. She has been taking amlodipine 5mg  and Maxzide 75-50mg . Metoprolol was d/c on 04/28/17 due to bradycardia.     No Known Allergies   Current Outpatient Prescriptions:  .  amLODipine (NORVASC) 5 MG tablet, Take 1 tablet (5 mg total) by mouth daily., Disp: 90 tablet, Rfl: 1 .  phentermine (ADIPEX-P) 37.5 MG tablet, Take 1 tablet (37.5 mg total) by mouth daily before breakfast., Disp: 30 tablet, Rfl: 2 .  triamterene-hydrochlorothiazide (MAXZIDE) 75-50 MG tablet, TAKE 1 TABLET EVERY DAY, Disp: 30 tablet, Rfl: 1 .  Vitamin D, Ergocalciferol, (DRISDOL) 50000 units CAPS capsule, Take 1 capsule (50,000 Units total) by mouth every 7 (seven) days., Disp: 12 capsule, Rfl: 3  Review of Systems  Constitutional: Positive for fatigue.  Respiratory: Negative for cough, chest tightness, shortness of breath and wheezing.   Cardiovascular: Negative for chest pain, palpitations and leg swelling.  Gastrointestinal: Negative for abdominal pain.  Neurological: Negative for dizziness, light-headedness and headaches.  Psychiatric/Behavioral: Negative.     Social History  Substance Use Topics  . Smoking status: Never Smoker  . Smokeless tobacco: Never Used  . Alcohol use 0.0 oz/week     Comment: occasionally   Objective:   BP (!) 140/92 (BP Location: Left Arm, Patient Position: Sitting,  Cuff Size: Normal)   Pulse (!) 116   Temp 98.8 F (37.1 C) (Oral)   Resp 16   Ht 5\' 7"  (1.702 m)   Wt 228 lb (103.4 kg)   BMI 35.71 kg/m    Physical Exam  Constitutional: She appears well-developed and well-nourished. No distress.  Neck: Normal range of motion. Neck supple. No JVD present. No tracheal deviation present. No thyromegaly present.  Cardiovascular: Regular rhythm and normal heart sounds.  Tachycardia present.  Exam reveals no gallop and no friction rub.   No murmur heard. Pulmonary/Chest: Effort normal and breath sounds normal. No respiratory distress. She has no wheezes. She has no rales.  Lymphadenopathy:    She has no cervical adenopathy.  Skin: She is not diaphoretic.  Vitals reviewed.      Assessment & Plan:     1. Encounter for biometric screening Will check labs as below and f/u pending results. - Lipid panel - HgB A1c  2. Essential hypertension Not to goal. Will increase amlodipine to 10mg  as below. Will check labs as below and f/u pending results. I will see her back in 4 weeks to recheck her BP. May consider adding low dose metoprolol if BP and HR still elevated.  - amLODipine (NORVASC) 10 MG tablet; Take 1 tablet (10 mg total) by mouth daily.  Dispense: 90 tablet; Refill: 1 - Lipid panel - HgB A1c - CBC w/Diff/Platelet - Comprehensive Metabolic Panel (CMET)  3. Hypercholesterolemia without hypertriglyceridemia Will check labs as below and f/u pending results. - Lipid panel -  Comprehensive Metabolic Panel (CMET)  4. Borderline diabetes Will check labs as below and f/u pending results. - HgB A1c - Comprehensive Metabolic Panel (CMET)  5. BMI 35.0-35.9,adult Currently using phentermine. Advised that with BP and HR I would like to change her to Contrave. Patient refuses at this time and wishes to take every other day or try half a tab.  - Lipid panel - HgB A1c - Comprehensive Metabolic Panel (CMET)       Margaretann Loveless, PA-C    [facility]

## 2017-05-21 ENCOUNTER — Telehealth: Payer: Self-pay

## 2017-05-21 LAB — LIPID PANEL
CHOL/HDL RATIO: 4.1 ratio (ref 0.0–4.4)
Cholesterol, Total: 215 mg/dL — ABNORMAL HIGH (ref 100–199)
HDL: 53 mg/dL (ref >39)
LDL Calculated: 149 mg/dL — ABNORMAL HIGH (ref 0–99)
Triglycerides: 63 mg/dL (ref 0–149)
VLDL Cholesterol Cal: 13 mg/dL (ref 5–40)

## 2017-05-21 LAB — COMPREHENSIVE METABOLIC PANEL
A/G RATIO: 1.3 (ref 1.2–2.2)
ALK PHOS: 39 IU/L (ref 39–117)
ALT: 27 IU/L (ref 0–32)
AST: 18 IU/L (ref 0–40)
Albumin: 4.9 g/dL (ref 3.5–5.5)
BILIRUBIN TOTAL: 1.1 mg/dL (ref 0.0–1.2)
BUN/Creatinine Ratio: 15 (ref 9–23)
BUN: 16 mg/dL (ref 6–24)
CALCIUM: 9.7 mg/dL (ref 8.7–10.2)
CHLORIDE: 97 mmol/L (ref 96–106)
CO2: 24 mmol/L (ref 20–29)
Creatinine, Ser: 1.08 mg/dL — ABNORMAL HIGH (ref 0.57–1.00)
GFR calc Af Amer: 74 mL/min/{1.73_m2} (ref >59)
GFR, EST NON AFRICAN AMERICAN: 64 mL/min/{1.73_m2} (ref >59)
Globulin, Total: 3.8 g/dL (ref 1.5–4.5)
Glucose: 109 mg/dL — ABNORMAL HIGH (ref 65–99)
Potassium: 3.6 mmol/L (ref 3.5–5.2)
Sodium: 139 mmol/L (ref 134–144)
Total Protein: 8.7 g/dL — ABNORMAL HIGH (ref 6.0–8.5)

## 2017-05-21 LAB — CBC WITH DIFFERENTIAL/PLATELET
BASOS: 1 %
Basophils Absolute: 0 10*3/uL (ref 0.0–0.2)
EOS (ABSOLUTE): 0.1 10*3/uL (ref 0.0–0.4)
Eos: 1 %
Hematocrit: 39.7 % (ref 34.0–46.6)
Hemoglobin: 13 g/dL (ref 11.1–15.9)
Immature Grans (Abs): 0 10*3/uL (ref 0.0–0.1)
Immature Granulocytes: 0 %
Lymphocytes Absolute: 2.5 10*3/uL (ref 0.7–3.1)
Lymphs: 37 %
MCH: 25.8 pg — ABNORMAL LOW (ref 26.6–33.0)
MCHC: 32.7 g/dL (ref 31.5–35.7)
MCV: 79 fL (ref 79–97)
MONOS ABS: 0.4 10*3/uL (ref 0.1–0.9)
Monocytes: 6 %
Neutrophils Absolute: 3.7 10*3/uL (ref 1.4–7.0)
Neutrophils: 55 %
PLATELETS: 388 10*3/uL — AB (ref 150–379)
RBC: 5.04 x10E6/uL (ref 3.77–5.28)
RDW: 14.3 % (ref 12.3–15.4)
WBC: 6.6 10*3/uL (ref 3.4–10.8)

## 2017-05-21 LAB — HEMOGLOBIN A1C
Est. average glucose Bld gHb Est-mCnc: 117 mg/dL
HEMOGLOBIN A1C: 5.7 % — AB (ref 4.8–5.6)

## 2017-05-21 NOTE — Telephone Encounter (Signed)
Patient advised as directed below.  Thanks,  -Cynthia Stainback 

## 2017-05-21 NOTE — Telephone Encounter (Signed)
-----   Message from Margaretann LovelessJennifer M Burnette, [place]-C sent at 05/21/2017 10:23 AM EDT ----- Cholesterol is up some from last year, but only minimally. A1c has improved from 5.9 to 5.7. All other labs are stable. Continue working on healthy lifestyle changes with limiting fatty foods, carbs, sugars and red meats. Continue increasing physical activity and try to get in 150 min of moderate activity per week. We will continue working on weight loss as well that will also hopefully help these numbers.

## 2017-05-21 NOTE — Telephone Encounter (Signed)
LMTCB  Thanks,  -Joseline 

## 2017-06-14 ENCOUNTER — Other Ambulatory Visit: Payer: Self-pay | Admitting: Physician Assistant

## 2017-06-14 DIAGNOSIS — R001 Bradycardia, unspecified: Secondary | ICD-10-CM

## 2017-06-14 DIAGNOSIS — R5383 Other fatigue: Secondary | ICD-10-CM

## 2017-06-14 DIAGNOSIS — I10 Essential (primary) hypertension: Secondary | ICD-10-CM

## 2017-06-17 ENCOUNTER — Encounter: Payer: Self-pay | Admitting: Physician Assistant

## 2017-06-17 ENCOUNTER — Ambulatory Visit (INDEPENDENT_AMBULATORY_CARE_PROVIDER_SITE_OTHER): Payer: 59 | Admitting: Physician Assistant

## 2017-06-17 VITALS — BP 120/88 | HR 90 | Temp 98.5°F | Resp 16 | Ht 67.0 in | Wt 230.0 lb

## 2017-06-17 DIAGNOSIS — M7662 Achilles tendinitis, left leg: Secondary | ICD-10-CM | POA: Diagnosis not present

## 2017-06-17 DIAGNOSIS — I10 Essential (primary) hypertension: Secondary | ICD-10-CM

## 2017-06-17 MED ORDER — PREDNISONE 10 MG (21) PO TBPK: ORAL_TABLET | ORAL | 0 refills | Status: DC

## 2017-06-17 NOTE — Patient Instructions (Signed)
Achilles Bursitis Bursitis is inflammation and irritation of a bursa, which is one of the small, fluid-filled sacs that cushion and protect the moving parts of your body. These sacs are located between bones and muscles, muscle attachments, or skin areas next to bones. A bursa protects these structures from the wear and tear that results from frequent movement. An inflamed bursa causes pain and swelling. Fluid may build up inside the sac. Bursitis is most common near joints, especially the knees, elbows, hips, and shoulders. What are the causes? Bursitis can be caused by:  Injury from: ? A direct blow, like falling on your knee or elbow. ? Overuse of a joint (repetitive stress).  Infection. This can happen if bacteria gets into a bursa through a cut or scrape near a joint.  Diseases that cause joint inflammation, such as gout and rheumatoid arthritis.  What increases the risk? You may be at risk for bursitis if you:  Have a job or hobby that involves a lot of repetitive stress on your joints.  Have a condition that weakens your body's defense system (immune system), such as diabetes, cancer, or HIV.  Lift and reach overhead often.  Kneel or lean on hard surfaces often.  Run or walk often.  What are the signs or symptoms? The most common signs and symptoms of bursitis are:  Pain that gets worse when you move the affected body part or put weight on it.  Inflammation.  Stiffness.  Other signs and symptoms may include:  Redness.  Tenderness.  Warmth.  Pain that continues after rest.  Fever and chills. This may occur in bursitis caused by infection.  How is this diagnosed? Bursitis may be diagnosed by:  Medical history and physical exam.  MRI.  A procedure to drain fluid from the bursa with a needle (aspiration). The fluid may be checked for signs of infection or gout.  Blood tests to rule out other causes of inflammation.  How is this treated? Bursitis can  usually be treated at home with rest, ice, compression, and elevation (RICE). For mild bursitis, RICE treatment may be all you need. Other treatments may include:  Nonsteroidal anti-inflammatory drugs (NSAIDs) to treat pain and inflammation.  Corticosteroids to fight inflammation. You may have these drugs injected into and around the area of bursitis.  Aspiration of bursitis fluid to relieve pain and improve movement.  Antibiotic medicine to treat an infected bursa.  A splint, brace, or walking aid.  Physical therapy if you continue to have pain or limited movement.  Surgery to remove a damaged or infected bursa. This may be needed if you have a very bad case of bursitis or if other treatments have not worked.  Follow these instructions at home:  Take medicines only as directed by your health care provider.  If you were prescribed an antibiotic medicine, finish it all even if you start to feel better.  Rest the affected area as directed by your health care provider. ? Keep the area elevated. ? Avoid activities that make pain worse.  Apply ice to the injured area: ? Place ice in a plastic bag. ? Place a towel between your skin and the bag. ? Leave the ice on for 20 minutes, 2-3 times a day.  Use splints, braces, pads, or walking aids as directed by your health care provider.  Keep all follow-up visits as directed by your health care provider. This is important. How is this prevented?  Wear knee pads if you kneel often.  Wear sturdy running or walking shoes that fit you well.  Take regular breaks from repetitive activity.  Warm up by stretching before doing any strenuous activity.  Maintain a healthy weight or lose weight as recommended by your health care provider. Ask your health care provider if you need help.  Exercise regularly. Start any new physical activity gradually. Contact a health care provider if:  Your bursitis is not responding to treatment or home  care.  You have a fever.  You have chills. This information is not intended to replace advice given to you by your health care provider. Make sure you discuss any questions you have with your health care provider. Document Released: 10/23/2000 Document Revised: 04/02/2016 Document Reviewed: 01/15/2014 Elsevier Interactive Patient Education  2018 [employer].

## 2017-06-17 NOTE — Progress Notes (Signed)
Patient: Amanda Ramsey Female    DOB: 03/30/1976   41 y.o.   MRN: [id number] Visit Date: 06/17/2017  Today's Provider: Margaretann Loveless, PA-C   Chief Complaint  Patient presents with  . Follow-up   Subjective:    HPI  Follow up for hypertension  The patient was last seen for this 4 weeks ago. Changes made at last visit include increase amlodipine to 10 mg. Patient advised to continue monitoring blood pressure and heart rate.   She reports excellent compliance with treatment. She feels that condition is Unchanged. She is not having side effects.   Patient reports she is checking BP and has been some what elevated.   She also reports having some swelling on the left achilles tendon. She reports this has been there for a while but she is starting to notice it more frequently now. ------------------------------------------------------------------------------------    No Known Allergies   Current Outpatient Prescriptions:  .  amLODipine (NORVASC) 10 MG tablet, Take 1 tablet (10 mg total) by mouth daily., Disp: 90 tablet, Rfl: 1 .  phentermine (ADIPEX-P) 37.5 MG tablet, Take 1 tablet (37.5 mg total) by mouth daily before breakfast., Disp: 30 tablet, Rfl: 2 .  triamterene-hydrochlorothiazide (MAXZIDE) 75-50 MG tablet, TAKE 1 TABLET EVERY DAY, Disp: 30 tablet, Rfl: 1 .  Vitamin D, Ergocalciferol, (DRISDOL) 50000 units CAPS capsule, Take 1 capsule (50,000 Units total) by mouth every 7 (seven) days., Disp: 12 capsule, Rfl: 3  Review of Systems  Constitutional: Negative.   Respiratory: Negative.   Cardiovascular: Positive for leg swelling.  Gastrointestinal: Negative.   Genitourinary: Negative.   Neurological: Negative.     Social History  Substance Use Topics  . Smoking status: Never Smoker  . Smokeless tobacco: Never Used  . Alcohol use 0.0 oz/week     Comment: occasionally   Objective:   BP 120/88 (BP Location: Left Arm, Patient Position: Sitting, Cuff  Size: Large)   Pulse 90   Temp 98.5 F (36.9 C) (Oral)   Resp 16   Ht 5\' 7"  (1.702 m)   Wt 230 lb (104.3 kg)   SpO2 98%   BMI 36.02 kg/m  Vitals:   06/17/17 1642  BP: 120/88  Pulse: 90  Resp: 16  Temp: 98.5 F (36.9 C)  TempSrc: Oral  SpO2: 98%  Weight: 230 lb (104.3 kg)  Height: 5\' 7"  (1.702 m)     Physical Exam  Constitutional: She appears well-developed and well-nourished. No distress.  Neck: Normal range of motion. Neck supple. No JVD present. No tracheal deviation present. No thyromegaly present.  Cardiovascular: Normal rate, regular rhythm and normal heart sounds.  Exam reveals no gallop and no friction rub.   No murmur heard. Pulmonary/Chest: Effort normal and breath sounds normal. No respiratory distress. She has no wheezes. She has no rales.  Musculoskeletal:       Left foot: There is tenderness (over achilles bursa) and swelling (over achilles insertion). There is normal range of motion, no bony tenderness, normal capillary refill, no crepitus, no deformity and no laceration.  Lymphadenopathy:    She has no cervical adenopathy.  Skin: She is not diaphoretic.  Vitals reviewed.      Assessment & Plan:     1. Achilles bursitis of left lower extremity Will give steroid taper for achilles bursitis. Advised patient to wear better shoes and avoid sandals, flats or bare feet. Discussed stretching and massages as well. She is to call if no improvement.  -  predniSONE (STERAPRED UNI-PAK 21 TAB) 10 MG (21) TBPK tablet; Take as directed on package instructions  Dispense: 21 tablet; Refill: 0  2. Essential (primary) hypertension BP and HR doing well in the office today. Continue Maxzide 75-25mg  and amlodipine 10mg .       Margaretann LovelessJennifer M Jesilyn Easom, PA-C  [facility]

## 2017-06-22 ENCOUNTER — Telehealth: Payer: Self-pay | Admitting: Physician Assistant

## 2017-06-22 NOTE — Telephone Encounter (Signed)
[facility] [address] faxed refill request for the following medications: metoprolol succinate (TOPROL-XL) 50 MG 24 hr tablet 90 day supply  Last Rx: 04/26/17 LOV: 06/17/17 Please advise. Thanks TNP

## 2017-06-22 NOTE — Telephone Encounter (Signed)
This was discontinued right?

## 2017-06-22 NOTE — Telephone Encounter (Signed)
Yes this med has been D/C due to bradycardia

## 2017-06-23 ENCOUNTER — Telehealth: Payer: Self-pay | Admitting: Physician Assistant

## 2017-06-23 NOTE — Telephone Encounter (Signed)
Re faxed form along with lab results. Left patient a voicemail advising her that biometric screening form has been re faxed.

## 2017-06-23 NOTE — Telephone Encounter (Signed)
Pt states she had her biometric screening last month and Antony ContrasJenni completed a form and was suppose to fax the form to [facility].  [facility] has not rec'd this info.  AV#[phone]/WGCB#[phone]/MW

## 2017-07-09 ENCOUNTER — Telehealth: Payer: Self-pay

## 2017-07-09 NOTE — Telephone Encounter (Signed)
LM that her biometric appeal Form is placed up front ready for pick up.  Thanks,  -Anasha Perfecto

## 2017-10-12 ENCOUNTER — Telehealth: Payer: Self-pay | Admitting: Physician Assistant

## 2017-10-12 NOTE — Telephone Encounter (Signed)
Pt states she read an article that stated amlodipine has an ingredient that cause cancer and has been recalled.  Pt wants to know if this is true and she wants to stop taking the medication if that is the case.

## 2017-10-13 NOTE — Telephone Encounter (Signed)
This is only from one manufacturer. Would recommend for her to check with her pharmacy to see if they use the manufacturer that is involved in the recall.

## 2017-10-13 NOTE — Telephone Encounter (Signed)
Patient advised as directed below. She was also advised that Antony ContrasJenni did more research on the recalled and is the Amlodipine with the combination of Valsartan.  Thanks,  -Joseline

## 2017-10-13 NOTE — Telephone Encounter (Signed)
LMTCB  Thanks,  -Lizzette Carbonell 

## 2017-10-13 NOTE — Telephone Encounter (Signed)
Please Review.  Thanks,  -Verleen Stuckey 

## 2017-10-29 ENCOUNTER — Other Ambulatory Visit: Payer: Self-pay | Admitting: Physician Assistant

## 2017-10-29 DIAGNOSIS — I10 Essential (primary) hypertension: Secondary | ICD-10-CM

## 2017-11-18 ENCOUNTER — Other Ambulatory Visit: Payer: Self-pay | Admitting: Physician Assistant

## 2017-11-18 DIAGNOSIS — I10 Essential (primary) hypertension: Secondary | ICD-10-CM

## 2017-12-29 ENCOUNTER — Other Ambulatory Visit: Payer: Self-pay | Admitting: Physician Assistant

## 2017-12-29 DIAGNOSIS — E559 Vitamin D deficiency, unspecified: Secondary | ICD-10-CM

## 2018-05-18 ENCOUNTER — Other Ambulatory Visit: Payer: Self-pay | Admitting: Physician Assistant

## 2018-05-18 DIAGNOSIS — I10 Essential (primary) hypertension: Secondary | ICD-10-CM

## 2018-05-25 ENCOUNTER — Other Ambulatory Visit: Payer: Self-pay | Admitting: Physician Assistant

## 2018-05-25 DIAGNOSIS — I10 Essential (primary) hypertension: Secondary | ICD-10-CM

## 2018-06-14 ENCOUNTER — Encounter: Payer: Self-pay | Admitting: Physician Assistant

## 2018-06-14 ENCOUNTER — Ambulatory Visit (INDEPENDENT_AMBULATORY_CARE_PROVIDER_SITE_OTHER): Payer: Managed Care, Other (non HMO) | Admitting: Physician Assistant

## 2018-06-14 VITALS — BP 124/84 | HR 78 | Temp 98.0°F | Ht 67.0 in | Wt 224.0 lb

## 2018-06-14 DIAGNOSIS — Z0000 Encounter for general adult medical examination without abnormal findings: Secondary | ICD-10-CM

## 2018-06-14 DIAGNOSIS — G5762 Lesion of plantar nerve, left lower limb: Secondary | ICD-10-CM

## 2018-06-14 DIAGNOSIS — Z0189 Encounter for other specified special examinations: Secondary | ICD-10-CM | POA: Diagnosis not present

## 2018-06-14 DIAGNOSIS — E559 Vitamin D deficiency, unspecified: Secondary | ICD-10-CM

## 2018-06-14 DIAGNOSIS — I10 Essential (primary) hypertension: Secondary | ICD-10-CM | POA: Diagnosis not present

## 2018-06-14 DIAGNOSIS — R7303 Prediabetes: Secondary | ICD-10-CM

## 2018-06-14 DIAGNOSIS — Z1159 Encounter for screening for other viral diseases: Secondary | ICD-10-CM

## 2018-06-14 DIAGNOSIS — Z113 Encounter for screening for infections with a predominantly sexual mode of transmission: Secondary | ICD-10-CM

## 2018-06-14 DIAGNOSIS — E7800 Pure hypercholesterolemia, unspecified: Secondary | ICD-10-CM

## 2018-06-14 DIAGNOSIS — Z1239 Encounter for other screening for malignant neoplasm of breast: Secondary | ICD-10-CM

## 2018-06-14 DIAGNOSIS — Z1231 Encounter for screening mammogram for malignant neoplasm of breast: Secondary | ICD-10-CM

## 2018-06-14 DIAGNOSIS — Z008 Encounter for other general examination: Secondary | ICD-10-CM

## 2018-06-14 MED ORDER — METHYLPREDNISOLONE 4 MG PO TBPK: ORAL_TABLET | ORAL | 0 refills | Status: DC

## 2018-06-14 NOTE — Progress Notes (Signed)
Patient: Amanda Ramsey, Female    DOB: 1976-10-14, 42 y.o.   MRN: [id number] Visit Date: 06/14/2018  Today's Provider: Margaretann LovelessJennifer M Burnette, PA-C   Chief Complaint  Patient presents with  . Annual Exam  . Foot Pain    Started Sunday   Subjective:    Annual physical exam Amanda Ramsey is a 42 y.o. female who presents today for health maintenance and complete physical. She feels fairly well. She reports exercising regularly. She reports she is sleeping fairly well. -----------------------------------------------------------------  Foot Pain  This is a new problem. The current episode started in the past 7 days. The problem occurs constantly. The problem has been gradually improving. Associated symptoms include arthralgias (left foot) and joint swelling (left foot). The symptoms are aggravated by walking and bending (Weight bearing). She has tried NSAIDs for the symptoms. The treatment provided mild relief.     Review of Systems  Constitutional: Negative.   HENT: Negative.   Eyes: Negative.   Respiratory: Negative.   Cardiovascular: Negative.   Gastrointestinal: Negative.   Endocrine: Negative.   Genitourinary: Negative.   Musculoskeletal: Positive for arthralgias (left foot), gait problem (antalgic) and joint swelling (left foot).  Skin: Negative.   Allergic/Immunologic: Negative.   Hematological: Negative.   Psychiatric/Behavioral: Negative.     Social History      She  reports that she has never smoked. She has never used smokeless tobacco. She reports that she drinks alcohol. She reports that she does not use drugs.       Social History   Socioeconomic History  . Marital status: Single    Spouse name: Not on file  . Number of children: Not on file  . Years of education: Not on file  . Highest education level: Not on file  Occupational History  . Not on file  Social Needs  . Financial resource strain: Not on file  . Food insecurity:    Worry: Not  on file    Inability: Not on file  . Transportation needs:    Medical: Not on file    Non-medical: Not on file  Tobacco Use  . Smoking status: Never Smoker  . Smokeless tobacco: Never Used  Substance and Sexual Activity  . Alcohol use: Yes    Alcohol/week: 0.0 oz    Comment: occasionally  . Drug use: No  . Sexual activity: Not on file  Lifestyle  . Physical activity:    Days per week: Not on file    Minutes per session: Not on file  . Stress: Not on file  Relationships  . Social connections:    Talks on phone: Not on file    Gets together: Not on file    Attends religious service: Not on file    Active member of club or organization: Not on file    Attends meetings of clubs or organizations: Not on file    Relationship status: Not on file  Other Topics Concern  . Not on file  Social History Narrative  . Not on file    No past medical history on file.   Patient Active Problem List   Diagnosis Date Noted  . Obesity 04/08/2016  . Dizziness and giddiness 04/25/2015  . Accumulation of fluid in tissues 04/25/2015  . Gastritis, Helicobacter pylori 04/25/2015  . Decreased potassium in the blood 04/25/2015  . Borderline diabetes 04/25/2015  . Avitaminosis D 04/25/2015  . Abnormal ECG 06/01/2014  . Chest pain 06/01/2014  .  Irregular bleeding 03/18/2009  . Adiposity 09/28/2003  . Essential (primary) hypertension 11/17/2001  . Hypercholesterolemia without hypertriglyceridemia 11/29/2000  . Family history of cardiovascular disease 11/09/1998    Past Surgical History:  Procedure Laterality Date  . ABDOMINAL HYSTERECTOMY  2011  . CHOLECYSTECTOMY      Family History        Family Status  Relation Name Status  . Mother  Alive  . Father  Deceased  . Sister  Alive  . Neg Hx  (Not Specified)        Her family history includes Alcohol abuse in her father; Cirrhosis in her father; Hypertension in her mother and sister; Seizures in her father. There is no history of  Breast cancer.      No Known Allergies   Current Outpatient Medications:  .  amLODipine (NORVASC) 10 MG tablet, TAKE 1 TABLET BY MOUTH EVERY DAY **MUST USE MAIL ORDER**, Disp: 90 tablet, Rfl: 1 .  triamterene-hydrochlorothiazide (MAXZIDE) 75-50 MG tablet, TAKE 1 TABLET BY MOUTH EVERY DAY, Disp: 30 tablet, Rfl: 5 .  Vitamin D, Ergocalciferol, (DRISDOL) 50000 units CAPS capsule, TAKE ONE CAPSULE BY MOUTH EVERY 7 DAYS, Disp: 12 capsule, Rfl: 3 .  phentermine (ADIPEX-P) 37.5 MG tablet, Take 1 tablet (37.5 mg total) by mouth daily before breakfast. (Patient not taking: Reported on 06/14/2018), Disp: 30 tablet, Rfl: 2 .  predniSONE (STERAPRED UNI-PAK 21 TAB) 10 MG (21) TBPK tablet, Take as directed on package instructions (Patient not taking: Reported on 06/14/2018), Disp: 21 tablet, Rfl: 0   Patient Care Team: Reine Just as PCP - General (Physician Assistant)      Objective:   Vitals: BP 124/84 (BP Location: Left Arm, Patient Position: Sitting, Cuff Size: Large)   Pulse 78   Temp 98 F (36.7 C) (Oral)   Ht 5\' 7"  (1.702 m)   Wt 224 lb (101.6 kg)   SpO2 98%   BMI 35.08 kg/m    Vitals:   06/14/18 1348  BP: 124/84  Pulse: 78  Temp: 98 F (36.7 C)  TempSrc: Oral  SpO2: 98%  Weight: 224 lb (101.6 kg)  Height: 5\' 7"  (1.702 m)     Physical Exam  Constitutional: She is oriented to person, place, and time. She appears well-developed and well-nourished. No distress.  HENT:  Head: Normocephalic and atraumatic.  Right Ear: Hearing, tympanic membrane, external ear and ear canal normal.  Left Ear: Hearing, tympanic membrane, external ear and ear canal normal.  Nose: Nose normal.  Mouth/Throat: Uvula is midline, oropharynx is clear and moist and mucous membranes are normal. No oropharyngeal exudate.  Eyes: Pupils are equal, round, and reactive to light. Conjunctivae and EOM are normal. Right eye exhibits no discharge. Left eye exhibits no discharge. No scleral icterus.  Neck:  Normal range of motion. Neck supple. No JVD present. No tracheal deviation present. No thyromegaly present.  Cardiovascular: Normal rate, regular rhythm, normal heart sounds and intact distal pulses. Exam reveals no gallop and no friction rub.  No murmur heard. Pulmonary/Chest: Effort normal and breath sounds normal. No respiratory distress. She has no wheezes. She has no rales. She exhibits no tenderness.  Abdominal: Soft. Bowel sounds are normal. She exhibits no distension and no mass. There is no tenderness. There is no rebound and no guarding.  Musculoskeletal: She exhibits no edema.       Left foot: There is decreased range of motion, tenderness and swelling. There is no bony tenderness and normal capillary refill.  Feet:  Lymphadenopathy:    She has no cervical adenopathy.  Neurological: She is alert and oriented to person, place, and time.  Skin: Skin is warm and dry. No rash noted. She is not diaphoretic.  Psychiatric: She has a normal mood and affect. Her behavior is normal. Judgment and thought content normal.  Vitals reviewed.    Depression Screen PHQ 2/9 Scores 05/20/2017 04/28/2017 04/21/2017  PHQ - 2 Score 1 3 4   PHQ- 9 Score 5 11 11       Assessment & Plan:     Routine Health Maintenance and Physical Exam  Exercise Activities and Dietary recommendations Goals    None      Immunization History  Administered Date(s) Administered  . Td 03/18/2005  . Tdap 09/24/2011    Health Maintenance  Topic Date Due  . INFLUENZA VACCINE  06/09/2018  . PAP SMEAR  05/26/2019  . TETANUS/TDAP  09/23/2021  . HIV Screening  Completed     Discussed health benefits of physical activity, and encouraged her to engage in regular exercise appropriate for her age and condition.    1. Morton's neuroma of left foot Suspect Morton's Neuroma due to location of pain and positive squeeze. Discussed not wearing shoes with a small toe box. Will try steroid taper as below. Discussed  possible podiatry referral but she declines as she does not want a steroid injection or surgery at this time. She is to call if no response to steroid taper.  - methylPREDNISolone (MEDROL) 4 MG TBPK tablet; 6 day taper; take as directed on package  Dispense: 21 tablet; Refill: 0  2. Encounter for biometric screening Will check labs as below and f/u pending results. - CBC w/Diff/Platelet - Comprehensive Metabolic Panel (CMET) - TSH - Lipid Profile - HgB A1c  3. Annual physical exam Normal physical exam today. Will check labs as below and f/u pending lab results. If labs are stable and WNL she will not need to have these rechecked for one year at her next annual physical exam. She is to call the office in the meantime if she has any acute issue, questions or concerns. - CBC w/Diff/Platelet - Comprehensive Metabolic Panel (CMET) - TSH - Lipid Profile - HgB A1c  4. Essential (primary) hypertension Stable. Continue amlodipine 10mg  and Maxzide 75-50mg . Will check labs as below and f/u pending results. - CBC w/Diff/Platelet - Comprehensive Metabolic Panel (CMET) - TSH - Lipid Profile - HgB A1c  5. Avitaminosis D H/O this. Currently on high dose Vit D. Will check labs as below and f/u pending results. - CBC w/Diff/Platelet - Comprehensive Metabolic Panel (CMET) - TSH - Lipid Profile - HgB A1c  6. Borderline diabetes Diet controlled. Will check labs as below and f/u pending results. - CBC w/Diff/Platelet - Comprehensive Metabolic Panel (CMET) - TSH - Lipid Profile - HgB A1c  7. Hypercholesterolemia without hypertriglyceridemia Diet controlled. Will check labs as below and f/u pending results. - CBC w/Diff/Platelet - Comprehensive Metabolic Panel (CMET) - TSH - Lipid Profile - HgB A1c  8. Screen for STD (sexually transmitted disease) - HIV antibody (with reflex) - RPR - GC/Chlamydia Probe Amp([facility])  9. Breast cancer screening Breast exam today was normal. There is no  family history of breast cancer. She does perform regular self breast exams. Mammogram was ordered as below. Information for [facility] was given to patient so she may schedule her mammogram at her convenience. - MM Digital Screening; Future  10. Screening for measles - Measles/Mumps/Rubella  Immunity  --------------------------------------------------------------------    Margaretann Loveless, PA-C  [facility]

## 2018-06-14 NOTE — Patient Instructions (Addendum)
Morton Neuralgia Morton neuralgia is a type of foot pain in the area closest to your toes. This area is sometimes called the ball of your foot. Morton neuralgia occurs when a branch of a nerve in your foot (digital nerve) becomes compressed. When this happens over a long period of time, the nerve can thicken (neuroma) and cause pain. This usually occurs between the third and fourth toe. Morton neuralgia can come and go but may get worse over time. What are the causes? Your digital nerve can become compressed and stretched at a point where it passes under a thick band of tissue that connects your toes (intermetatarsal ligament). Morton neuralgia can be caused by mild repetitive damage in this area. This type of damage can result from:  Activities such as running or jumping.  Wearing shoes that are too tight.  What increases the risk? You may be at risk for Morton neuralgia if you:  Are female.  Wear high heels.  Wear shoes that are narrow or tight.  Participate in activities that stretch your toes. These include: ? Running. ? [place]. ? Long-distance walking.  What are the signs or symptoms? The first symptom of Morton neuralgia is pain that spreads from the ball of your foot to your toes. It may feel like you are walking on a marble. Pain usually gets worse with walking and goes away at night. Other symptoms may include numbness and cramping of your toes. How is this diagnosed? Your health care provider will do a physical exam. When doing the exam, your health care provider may:  Squeeze your foot just behind your toe.  Ask you to move your toes to check for pain.  You may also have tests on your foot to confirm the diagnosis. These may include:  An X-ray.  An MRI.  How is this treated? Treatment for Morton neuralgia may be as simple as changing the kind of shoes you wear. Other treatments may include:  Wearing a supportive pad (orthosis) under the front of your foot. This  lifts your toe bones and takes pressure off the nerve.  Getting injections of numbing medicine and anti-inflammatory medicine (steroid) in the nerve.  Having surgery to remove part of the thickened nerve.  Follow these instructions at home:  Take medicine only as directed by your health care provider.  Wear soft-soled shoes with a wide toe area.  Stop activities that may be causing pain.  Elevate your foot when resting.  Massage your foot.  Apply ice to the injured area: ? Put ice in a plastic bag. ? Place a towel between your skin and the bag. ? Leave the ice on for 20 minutes, 2-3 times a day.  Keep all follow-up visits as directed by your health care provider. This is important. Contact a health care provider if:  Home care instructions are not helping you get better.  Your symptoms change or get worse. This information is not intended to replace advice given to you by your health care provider. Make sure you discuss any questions you have with your health care provider. Document Released: 02/01/2001 Document Revised: 04/02/2016 Document Reviewed: 12/27/2013 Elsevier Interactive Patient Education  2018 [place] Maintenance, Female Adopting a healthy lifestyle and getting preventive care can go a long way to promote health and wellness. Talk with your health care provider about what schedule of regular examinations is right for you. This is a good chance for you to check in with your provider about  disease prevention and staying healthy. In between checkups, there are plenty of things you can do on your own. Experts have done a lot of research about which lifestyle changes and preventive measures are most likely to keep you healthy. Ask your health care provider for more information. Weight and diet Eat a healthy diet  Be sure to include plenty of vegetables, fruits, low-fat dairy products, and lean protein.  Do not eat a lot of foods high in solid fats,  added sugars, or salt.  Get regular exercise. This is one of the most important things you can do for your health. ? Most adults should exercise for at least 150 minutes each week. The exercise should increase your heart rate and make you sweat (moderate-intensity exercise). ? Most adults should also do strengthening exercises at least twice a week. This is in addition to the moderate-intensity exercise.  Maintain a healthy weight  Body mass index (BMI) is a measurement that can be used to identify possible weight problems. It estimates body fat based on height and weight. Your health care provider can help determine your BMI and help you achieve or maintain a healthy weight.  For females 46 years of age and older: ? A BMI below 18.5 is considered underweight. ? A BMI of 18.5 to 24.9 is normal. ? A BMI of 25 to 29.9 is considered overweight. ? A BMI of 30 and above is considered obese.  Watch levels of cholesterol and blood lipids  You should start having your blood tested for lipids and cholesterol at 43 years of age, then have this test every 5 years.  You may need to have your cholesterol levels checked more often if: ? Your lipid or cholesterol levels are high. ? You are older than 43 years of age. ? You are at high risk for heart disease.  Cancer screening Lung Cancer  Lung cancer screening is recommended for adults 65-65 years old who are at high risk for lung cancer because of a history of smoking.  A yearly low-dose CT scan of the lungs is recommended for people who: ? Currently smoke. ? Have quit within the past 15 years. ? Have at least a 30-pack-year history of smoking. A pack year is smoking an average of one pack of cigarettes a day for 1 year.  Yearly screening should continue until it has been 15 years since you quit.  Yearly screening should stop if you develop a health problem that would prevent you from having lung cancer treatment.  Breast Cancer  Practice  breast self-awareness. This means understanding how your breasts normally appear and feel.  It also means doing regular breast self-exams. Let your health care provider know about any changes, no matter how small.  If you are in your 20s or 30s, you should have a clinical breast exam (CBE) by a health care provider every 1-3 years as part of a regular health exam.  If you are 96 or older, have a CBE every year. Also consider having a breast X-ray (mammogram) every year.  If you have a family history of breast cancer, talk to your health care provider about genetic screening.  If you are at high risk for breast cancer, talk to your health care provider about having an MRI and a mammogram every year.  Breast cancer gene (BRCA) assessment is recommended for women who have family members with BRCA-related cancers. BRCA-related cancers include: ? Breast. ? Ovarian. ? Tubal. ? Peritoneal cancers.  Results of  the assessment will determine the need for genetic counseling and BRCA1 and BRCA2 testing.  Cervical Cancer Your health care provider may recommend that you be screened regularly for cancer of the pelvic organs (ovaries, uterus, and vagina). This screening involves a pelvic examination, including checking for microscopic changes to the surface of your cervix (Pap test). You may be encouraged to have this screening done every 3 years, beginning at age 47.  For women ages 74-65, health care providers may recommend pelvic exams and Pap testing every 3 years, or they may recommend the Pap and pelvic exam, combined with testing for human papilloma virus (HPV), every 5 years. Some types of HPV increase your risk of cervical cancer. Testing for HPV may also be done on women of any age with unclear Pap test results.  Other health care providers may not recommend any screening for nonpregnant women who are considered low risk for pelvic cancer and who do not have symptoms. Ask your health care provider  if a screening pelvic exam is right for you.  If you have had past treatment for cervical cancer or a condition that could lead to cancer, you need Pap tests and screening for cancer for at least 20 years after your treatment. If Pap tests have been discontinued, your risk factors (such as having a new sexual partner) need to be reassessed to determine if screening should resume. Some women have medical problems that increase the chance of getting cervical cancer. In these cases, your health care provider may recommend more frequent screening and Pap tests.  Colorectal Cancer  This type of cancer can be detected and often prevented.  Routine colorectal cancer screening usually begins at 43 years of age and continues through 43 years of age.  Your health care provider may recommend screening at an earlier age if you have risk factors for colon cancer.  Your health care provider may also recommend using home test kits to check for hidden blood in the stool.  A small camera at the end of a tube can be used to examine your colon directly (sigmoidoscopy or colonoscopy). This is done to check for the earliest forms of colorectal cancer.  Routine screening usually begins at age 23.  Direct examination of the colon should be repeated every 5-10 years through 43 years of age. However, you may need to be screened more often if early forms of precancerous polyps or small growths are found.  Skin Cancer  Check your skin from head to toe regularly.  Tell your health care provider about any new moles or changes in moles, especially if there is a change in a mole's shape or color.  Also tell your health care provider if you have a mole that is larger than the size of a pencil eraser.  Always use sunscreen. Apply sunscreen liberally and repeatedly throughout the day.  Protect yourself by wearing long sleeves, pants, a wide-brimmed hat, and sunglasses whenever you are outside.  Heart disease,  diabetes, and high blood pressure  High blood pressure causes heart disease and increases the risk of stroke. High blood pressure is more likely to develop in: ? People who have blood pressure in the high end of the normal range (130-139/85-89 mm Hg). ? People who are overweight or obese. ? People who are African American.  If you are 70-9 years of age, have your blood pressure checked every 3-5 years. If you are 61 years of age or older, have your blood pressure checked every  year. You should have your blood pressure measured twice-once when you are at a hospital or clinic, and once when you are not at a hospital or clinic. Record the average of the two measurements. To check your blood pressure when you are not at a hospital or clinic, you can use: ? An automated blood pressure machine at a pharmacy. ? A home blood pressure monitor.  If you are between 9 years and 51 years old, ask your health care provider if you should take aspirin to prevent strokes.  Have regular diabetes screenings. This involves taking a blood sample to check your fasting blood sugar level. ? If you are at a normal weight and have a low risk for diabetes, have this test once every three years after 43 years of age. ? If you are overweight and have a high risk for diabetes, consider being tested at a younger age or more often. Preventing infection Hepatitis B  If you have a higher risk for hepatitis B, you should be screened for this virus. You are considered at high risk for hepatitis B if: ? You were born in a country where hepatitis B is common. Ask your health care provider which countries are considered high risk. ? Your parents were born in a high-risk country, and you have not been immunized against hepatitis B (hepatitis B vaccine). ? You have HIV or AIDS. ? You use needles to inject street drugs. ? You live with someone who has hepatitis B. ? You have had sex with someone who has hepatitis B. ? You get  hemodialysis treatment. ? You take certain medicines for conditions, including cancer, organ transplantation, and autoimmune conditions.  Hepatitis C  Blood testing is recommended for: ? Everyone born from [birth year] through 1966. ? Anyone with known risk factors for hepatitis C.  Sexually transmitted infections (STIs)  You should be screened for sexually transmitted infections (STIs) including gonorrhea and chlamydia if: ? You are sexually active and are younger than 43 years of age. ? You are older than 43 years of age and your health care provider tells you that you are at risk for this type of infection. ? Your sexual activity has changed since you were last screened and you are at an increased risk for chlamydia or gonorrhea. Ask your health care provider if you are at risk.  If you do not have HIV, but are at risk, it may be recommended that you take a prescription medicine daily to prevent HIV infection. This is called pre-exposure prophylaxis (PrEP). You are considered at risk if: ? You are sexually active and do not regularly use condoms or know the HIV status of your partner(s). ? You take drugs by injection. ? You are sexually active with a partner who has HIV.  Talk with your health care provider about whether you are at high risk of being infected with HIV. If you choose to begin PrEP, you should first be tested for HIV. You should then be tested every 3 months for as long as you are taking PrEP. Pregnancy  If you are premenopausal and you may become pregnant, ask your health care provider about preconception counseling.  If you may become pregnant, take 400 to 800 micrograms (mcg) of folic acid every day.  If you want to prevent pregnancy, talk to your health care provider about birth control (contraception). Osteoporosis and menopause  Osteoporosis is a disease in which the bones lose minerals and strength with aging. This can result in  serious bone fractures. Your risk for  osteoporosis can be identified using a bone density scan.  If you are 50 years of age or older, or if you are at risk for osteoporosis and fractures, ask your health care provider if you should be screened.  Ask your health care provider whether you should take a calcium or vitamin D supplement to lower your risk for osteoporosis.  Menopause may have certain physical symptoms and risks.  Hormone replacement therapy may reduce some of these symptoms and risks. Talk to your health care provider about whether hormone replacement therapy is right for you. Follow these instructions at home:  Schedule regular health, dental, and eye exams.  Stay current with your immunizations.  Do not use any tobacco products including cigarettes, chewing tobacco, or electronic cigarettes.  If you are pregnant, do not drink alcohol.  If you are breastfeeding, limit how much and how often you drink alcohol.  Limit alcohol intake to no more than 1 drink per day for nonpregnant women. One drink equals 12 ounces of beer, 5 ounces of wine, or 1 ounces of hard liquor.  Do not use street drugs.  Do not share needles.  Ask your health care provider for help if you need support or information about quitting drugs.  Tell your health care provider if you often feel depressed.  Tell your health care provider if you have ever been abused or do not feel safe at home. This information is not intended to replace advice given to you by your health care provider. Make sure you discuss any questions you have with your health care provider. Document Released: 05/11/2011 Document Revised: 04/02/2016 Document Reviewed: 07/30/2015 Elsevier Interactive Patient Education  [employer].

## 2018-06-15 ENCOUNTER — Telehealth: Payer: Self-pay

## 2018-06-15 LAB — TSH: TSH: 3.53 u[IU]/mL (ref 0.450–4.500)

## 2018-06-15 LAB — CBC WITH DIFFERENTIAL/PLATELET
BASOS: 0 %
Basophils Absolute: 0 10*3/uL (ref 0.0–0.2)
EOS (ABSOLUTE): 0.1 10*3/uL (ref 0.0–0.4)
EOS: 1 %
HEMATOCRIT: 38.7 % (ref 34.0–46.6)
HEMOGLOBIN: 12.9 g/dL (ref 11.1–15.9)
IMMATURE GRANS (ABS): 0 10*3/uL (ref 0.0–0.1)
IMMATURE GRANULOCYTES: 0 %
LYMPHS: 41 %
Lymphocytes Absolute: 2.6 10*3/uL (ref 0.7–3.1)
MCH: 25.7 pg — ABNORMAL LOW (ref 26.6–33.0)
MCHC: 33.3 g/dL (ref 31.5–35.7)
MCV: 77 fL — AB (ref 79–97)
MONOCYTES: 5 %
MONOS ABS: 0.3 10*3/uL (ref 0.1–0.9)
NEUTROS PCT: 53 %
Neutrophils Absolute: 3.4 10*3/uL (ref 1.4–7.0)
Platelets: 412 10*3/uL (ref 150–450)
RBC: 5.02 x10E6/uL (ref 3.77–5.28)
RDW: 14.7 % (ref 12.3–15.4)
WBC: 6.5 10*3/uL (ref 3.4–10.8)

## 2018-06-15 LAB — COMPREHENSIVE METABOLIC PANEL
A/G RATIO: 1.1 — AB (ref 1.2–2.2)
ALT: 21 IU/L (ref 0–32)
AST: 17 IU/L (ref 0–40)
Albumin: 4.5 g/dL (ref 3.5–5.5)
Alkaline Phosphatase: 42 IU/L (ref 39–117)
BUN/Creatinine Ratio: 8 — ABNORMAL LOW (ref 9–23)
BUN: 8 mg/dL (ref 6–24)
Bilirubin Total: 0.6 mg/dL (ref 0.0–1.2)
CALCIUM: 9.9 mg/dL (ref 8.7–10.2)
CO2: 26 mmol/L (ref 20–29)
CREATININE: 0.98 mg/dL (ref 0.57–1.00)
Chloride: 97 mmol/L (ref 96–106)
GFR calc non Af Amer: 71 mL/min/{1.73_m2} (ref >59)
GFR, EST AFRICAN AMERICAN: 82 mL/min/{1.73_m2} (ref >59)
GLOBULIN, TOTAL: 4 g/dL (ref 1.5–4.5)
Glucose: 88 mg/dL (ref 65–99)
POTASSIUM: 3.2 mmol/L — AB (ref 3.5–5.2)
SODIUM: 142 mmol/L (ref 134–144)
TOTAL PROTEIN: 8.5 g/dL (ref 6.0–8.5)

## 2018-06-15 LAB — LIPID PANEL
CHOL/HDL RATIO: 3.6 ratio (ref 0.0–4.4)
CHOLESTEROL TOTAL: 220 mg/dL — AB (ref 100–199)
HDL: 61 mg/dL (ref >39)
LDL Calculated: 145 mg/dL — ABNORMAL HIGH (ref 0–99)
TRIGLYCERIDES: 71 mg/dL (ref 0–149)
VLDL CHOLESTEROL CAL: 14 mg/dL (ref 5–40)

## 2018-06-15 LAB — RPR: RPR Ser Ql: NONREACTIVE

## 2018-06-15 LAB — HEMOGLOBIN A1C
Est. average glucose Bld gHb Est-mCnc: 123 mg/dL
HEMOGLOBIN A1C: 5.9 % — AB (ref 4.8–5.6)

## 2018-06-15 LAB — MEASLES/MUMPS/RUBELLA IMMUNITY
MUMPS ABS, IGG: 16.6 AU/mL (ref >10.9)
RUBEOLA AB, IGG: 92.9 [AU]/ml (ref >29.9)
Rubella Antibodies, IGG: 2.25 index (ref >0.99)

## 2018-06-15 LAB — HIV ANTIBODY (ROUTINE TESTING W REFLEX): HIV Screen 4th Generation wRfx: NONREACTIVE

## 2018-06-15 NOTE — Telephone Encounter (Signed)
LMTCB  Thanks,  -Joseline 

## 2018-06-15 NOTE — Telephone Encounter (Signed)
-----  Message from Mar Daring, PA-C sent at 06/15/2018 10:14 AM EDT ----- Blood count is normal and stable. Kidney function and liver function normal. Potassium is borderline low. Recommend increasing potassium rich foods such as sweet potato, banana, leafy greens (spinach, kale), etc. We can recheck in 6-8 weeks if desired. Thyroid is normal. Cholesterol fairly stable from last year but still elevated. HDL is doing better (was 63 now 71) which offers cardioprotection. A1c up again to 5.9 from 5.7. Continue working on healthy lifestyle modifications. Immune to MMR. HIV negative. Syphilis testing negative. Urine still pending.

## 2018-06-15 NOTE — Telephone Encounter (Signed)
Pt advised.   Thanks,   -Floyed Masoud  

## 2018-06-16 ENCOUNTER — Telehealth: Payer: Self-pay

## 2018-06-16 LAB — GC/CHLAMYDIA PROBE AMP
Chlamydia trachomatis, NAA: NEGATIVE
Neisseria gonorrhoeae by PCR: NEGATIVE

## 2018-06-16 NOTE — Telephone Encounter (Signed)
LMTCB  Thanks,  -Matisha Termine 

## 2018-06-16 NOTE — Telephone Encounter (Signed)
-----   Message from Margaretann LovelessJennifer M Burnette, PA-C sent at 06/16/2018  3:16 PM EDT ----- GC/chlamydia urine testing was negative.

## 2018-06-16 NOTE — Telephone Encounter (Signed)
Patient advised as directed below.  Thanks,  -Donald Jacque 

## 2018-08-26 ENCOUNTER — Ambulatory Visit
Admission: RE | Admit: 2018-08-26 | Discharge: 2018-08-26 | Disposition: A | Discharge status: Home / Self Care | Payer: Managed Care, Other (non HMO) | Source: Ambulatory Visit | Attending: Physician Assistant | Admitting: Physician Assistant

## 2018-08-26 DIAGNOSIS — Z1239 Encounter for other screening for malignant neoplasm of breast: Secondary | ICD-10-CM | POA: Diagnosis not present

## 2018-08-29 ENCOUNTER — Telehealth: Payer: Self-pay

## 2018-08-29 NOTE — Telephone Encounter (Signed)
-----   Message from Margaretann Loveless, PA-C sent at 08/29/2018  9:10 AM EDT ----- Normal mammogram. Repeat screening in one year.

## 2018-08-29 NOTE — Telephone Encounter (Signed)
Patient was advised via message per DPR., PC

## 2018-10-03 ENCOUNTER — Other Ambulatory Visit: Payer: Self-pay | Admitting: Physician Assistant

## 2018-10-03 DIAGNOSIS — I10 Essential (primary) hypertension: Secondary | ICD-10-CM

## 2018-11-15 ENCOUNTER — Ambulatory Visit: Payer: Managed Care, Other (non HMO) | Admitting: Physician Assistant

## 2018-11-18 ENCOUNTER — Other Ambulatory Visit: Payer: Self-pay | Admitting: Physician Assistant

## 2018-11-18 DIAGNOSIS — I10 Essential (primary) hypertension: Secondary | ICD-10-CM

## 2018-11-21 ENCOUNTER — Ambulatory Visit: Payer: Managed Care, Other (non HMO) | Admitting: Physician Assistant

## 2018-11-21 ENCOUNTER — Encounter: Payer: Self-pay | Admitting: Physician Assistant

## 2018-11-21 DIAGNOSIS — Z713 Dietary counseling and surveillance: Secondary | ICD-10-CM

## 2018-11-21 DIAGNOSIS — Z6837 Body mass index (BMI) 37.0-37.9, adult: Secondary | ICD-10-CM

## 2018-11-21 MED ORDER — PHENTERMINE HCL 37.5 MG PO TABS: 37.5000 mg | ORAL_TABLET | Freq: Every day | ORAL | 2 refills | Status: DC

## 2018-11-21 NOTE — Patient Instructions (Signed)
Liraglutide injection (Weight Management) What is this medicine? LIRAGLUTIDE (LIR a GLOO tide) is used with a reduced calorie diet and exercise to help you lose weight. This medicine may be used for other purposes; ask your health care provider or pharmacist if you have questions. COMMON BRAND NAME(S): Saxenda What should I tell my health care provider before I take this medicine? They need to know if you have any of these conditions: -endocrine tumors (MEN 2) or if someone in your family had these tumors -gallbladder disease -high cholesterol -history of alcohol abuse problem -history of pancreatitis -kidney disease or if you are on dialysis -liver disease -previous swelling of the tongue, face, or lips with difficulty breathing, difficulty swallowing, hoarseness, or tightening of the throat -stomach problems -suicidal thoughts, plans, or attempt; a previous suicide attempt by you or a family member -thyroid cancer or if someone in your family had thyroid cancer -an unusual or allergic reaction to liraglutide, other medicines, foods, dyes, or preservatives -pregnant or trying to get pregnant -breast-feeding How should I use this medicine? This medicine is for injection under the skin of your upper leg, stomach area, or upper arm. You will be taught how to prepare and give this medicine. Use exactly as directed. Take your medicine at regular intervals. Do not take it more often than directed. It is important that you put your used needles and syringes in a special sharps container. Do not put them in a trash can. If you do not have a sharps container, call your pharmacist or healthcare provider to get one. A special MedGuide will be given to you by the pharmacist with each prescription and refill. Be sure to read this information carefully each time. Talk to your pediatrician regarding the use of this medicine in children. Special care may be needed. Overdosage: If you think you have taken  too much of this medicine contact a poison control center or emergency room at once. NOTE: This medicine is only for you. Do not share this medicine with others. What if I miss a dose? If you miss a dose, take it as soon as you can. If it is almost time for your next dose, take only that dose. Do not take double or extra doses. If you miss your dose for 3 days or more, call your doctor or health care professional to talk about how to restart this medicine. What may interact with this medicine? -insulin and other medicines for diabetes This list may not describe all possible interactions. Give your health care provider a list of all the medicines, herbs, non-prescription drugs, or dietary supplements you use. Also tell them if you smoke, drink alcohol, or use illegal drugs. Some items may interact with your medicine. What should I watch for while using this medicine? Visit your doctor or health care professional for regular checks on your progress. This medicine is intended to be used in addition to a healthy diet and appropriate exercise. The best results are achieved this way. Do not increase or in any way change your dose without consulting your doctor or health care professional. Drink plenty of fluids while taking this medicine. Check with your doctor or health care professional if you get an attack of severe diarrhea, nausea, and vomiting. The loss of too much body fluid can make it dangerous for you to take this medicine. This medicine may affect blood sugar levels. If you have diabetes, check with your doctor or health care professional before you change your diet   or the dose of your diabetic medicine. Patients and their families should watch out for worsening depression or thoughts of suicide. Also watch out for sudden changes in feelings such as feeling anxious, agitated, panicky, irritable, hostile, aggressive, impulsive, severely restless, overly excited and hyperactive, or not being able to  sleep. If this happens, especially at the beginning of treatment or after a change in dose, call your health care professional. What side effects may I notice from receiving this medicine? Side effects that you should report to your doctor or health care professional as soon as possible: -allergic reactions like skin rash, itching or hives, swelling of the face, lips, or tongue -breathing problems -diarrhea that continues or is severe -lump or swelling on the neck -severe nausea -signs and symptoms of infection like fever or chills; cough; sore throat; pain or trouble passing urine -signs and symptoms of low blood sugar such as feeling anxious, confusion, dizziness, increased hunger, unusually weak or tired, sweating, shakiness, cold, irritable, headache, blurred vision, fast heartbeat, loss of consciousness -signs and symptoms of kidney injury like trouble passing urine or change in the amount of urine -trouble swallowing -unusual stomach upset or pain -vomiting Side effects that usually do not require medical attention (report to your doctor or health care professional if they continue or are bothersome): -constipation -decreased appetite -diarrhea -fatigue -headache -nausea -pain, redness, or irritation at site where injected -stomach upset -stuffy or runny nose This list may not describe all possible side effects. Call your doctor for medical advice about side effects. You may report side effects to FDA at 1-800-FDA-1088. Where should I keep my medicine? Keep out of the reach of children. Store unopened pen in a refrigerator between 2 and 8 degrees C (36 and 46 degrees F). Do not freeze or use if the medicine has been frozen. Protect from light and excessive heat. After you first use the pen, it can be stored at room temperature between 15 and 30 degrees C (59 and 86 degrees F) or in a refrigerator. Throw away your used pen after 30 days or after the expiration date, whichever comes  first. Do not store your pen with the needle attached. If the needle is left on, medicine may leak from the pen. NOTE: This sheet is a summary. It may not cover all possible information. If you have questions about this medicine, talk to your doctor, pharmacist, or health care provider.  2019 Elsevier/[employer] Standard (2016-11-12 14:41:37)  

## 2018-11-21 NOTE — Progress Notes (Signed)
Patient: Amanda Ramsey Female    DOB: Sep 26, 1976   43 y.o.   MRN: [id number] Visit Date: 11/21/2018  Today's Provider: Margaretann Loveless, PA-C   No chief complaint on file.  Subjective:     HPI   Patient is here to discuss restarting Phentermine. She has gained 13 pounds since August 2019. She is also prediabetic, A1c 5.9. She has used phentermine in the past successfully.    No Known Allergies   Current Outpatient Medications:  .  amLODipine (NORVASC) 10 MG tablet, TAKE 1 TABLET BY MOUTH EVERY DAY, Disp: 90 tablet, Rfl: 0 .  triamterene-hydrochlorothiazide (MAXZIDE) 75-50 MG tablet, TAKE 1 TABLET BY MOUTH EVERY DAY, Disp: 90 tablet, Rfl: 1 .  Vitamin D, Ergocalciferol, (DRISDOL) 50000 units CAPS capsule, TAKE ONE CAPSULE BY MOUTH EVERY 7 DAYS, Disp: 12 capsule, Rfl: 3 .  methylPREDNISolone (MEDROL) 4 MG TBPK tablet, 6 day taper; take as directed on package (Patient not taking: Reported on 11/21/2018), Disp: 21 tablet, Rfl: 0 .  phentermine (ADIPEX-P) 37.5 MG tablet, Take 1 tablet (37.5 mg total) by mouth daily before breakfast. (Patient not taking: Reported on 06/14/2018), Disp: 30 tablet, Rfl: 2  Review of Systems  Constitutional: Negative for appetite change, chills, fatigue and fever.  Respiratory: Negative for chest tightness and shortness of breath.   Cardiovascular: Negative for chest pain and palpitations.  Gastrointestinal: Negative for abdominal pain, nausea and vomiting.  Neurological: Negative for dizziness and weakness.    Social History   Tobacco Use  . Smoking status: Never Smoker  . Smokeless tobacco: Never Used  Substance Use Topics  . Alcohol use: Yes    Alcohol/week: 0.0 standard drinks    Comment: occasionally      Objective:   BP (!) 136/94 (BP Location: Left Arm, Patient Position: Sitting, Cuff Size: Large)   Pulse 77   Temp 98.3 F (36.8 C) (Oral)   Resp 16   Ht 5\' 7"  (1.702 m)   Wt 237 lb (107.5 kg)   SpO2 98%   BMI 37.12 kg/m   Vitals:   11/21/18 1816  BP: (!) 136/94  Pulse: 77  Resp: 16  Temp: 98.3 F (36.8 C)  TempSrc: Oral  SpO2: 98%  Weight: 237 lb (107.5 kg)  Height: 5\' 7"  (1.702 m)     Physical Exam Vitals signs reviewed.  Constitutional:      General: She is not in acute distress.    Appearance: She is well-developed. She is not diaphoretic.  Neck:     Musculoskeletal: Normal range of motion and neck supple.  Cardiovascular:     Rate and Rhythm: Normal rate and regular rhythm.     Heart sounds: Normal heart sounds. No murmur. No friction rub. No gallop.   Pulmonary:     Effort: Pulmonary effort is normal. No respiratory distress.     Breath sounds: Normal breath sounds. No wheezing or rales.        Assessment & Plan    1. Encounter for weight loss counseling Will restart phentermine as below. She is going to look at her insurance to see if Bernie Covey is covered. She is going to call if it is and we will switch therapy. Continue food diary and limiting portions.  - phentermine (ADIPEX-P) 37.5 MG tablet; Take 1 tablet (37.5 mg total) by mouth daily before breakfast.  Dispense: 30 tablet; Refill: 2  2. Class 2 severe obesity due to excess calories with serious comorbidity and  body mass index (BMI) of 37.0 to 37.9 in adult [facility]) See above medical treatment plan. - phentermine (ADIPEX-P) 37.5 MG tablet; Take 1 tablet (37.5 mg total) by mouth daily before breakfast.  Dispense: 30 tablet; Refill: 2     Margaretann Loveless, PA-C  [facility]

## 2018-11-22 ENCOUNTER — Telehealth: Payer: Self-pay | Admitting: Physician Assistant

## 2018-11-22 DIAGNOSIS — Z713 Dietary counseling and surveillance: Secondary | ICD-10-CM

## 2018-11-22 DIAGNOSIS — Z6837 Body mass index (BMI) 37.0-37.9, adult: Secondary | ICD-10-CM

## 2018-11-22 NOTE — Telephone Encounter (Signed)
Pt checked with insurance to see if they will cover the Saxenda. Insurance will cover this.  Only needing a prior aurth - call [phone].  Please advise.  Thanks, [employer]

## 2018-11-23 MED ORDER — INSULIN PEN NEEDLE 32G X 4 MM MISC: 1 refills | Status: DC

## 2018-11-23 MED ORDER — LIRAGLUTIDE -WEIGHT MANAGEMENT 18 MG/3ML ~~LOC~~ SOPN: 0.6000 mg | PEN_INJECTOR | Freq: Every day | SUBCUTANEOUS | 3 refills | Status: DC

## 2018-11-23 NOTE — Telephone Encounter (Signed)
[facility]. Once she has pen she can schedule to be educated on how to use the pen.

## 2018-11-23 NOTE — Telephone Encounter (Addendum)
Left message on pt's vm advising message below.

## 2018-12-05 ENCOUNTER — Telehealth: Payer: Self-pay | Admitting: Physician Assistant

## 2018-12-05 NOTE — Telephone Encounter (Signed)
Pt called to let Antony ContrasJenni know her Insurance is denying the Rx  Liraglutide -Weight Management (SAXENDA) 18 MG/3ML SOPN.  Pt asking if Antony ContrasJenni could appeal the denial for her. To Appeal the Rx please call [phone].  Thanks, [employer]

## 2018-12-07 NOTE — Telephone Encounter (Signed)
Unfortunately we did the prior authorization for it and it was still denied.

## 2018-12-07 NOTE — Telephone Encounter (Signed)
Patient was advised.  

## 2018-12-15 ENCOUNTER — Telehealth: Payer: Self-pay | Admitting: Physician Assistant

## 2018-12-15 NOTE — Telephone Encounter (Signed)
Cayli w/ Cover My Meds - [phone]  Liraglutide -Weight Management (SAXENDA) 18 MG/3ML SOPN Can be resubmiited.  Ref key  -  [id number]  Thanks, [facility]

## 2018-12-16 NOTE — Telephone Encounter (Signed)
PA resubmitted.

## 2019-01-06 ENCOUNTER — Encounter: Payer: Self-pay | Admitting: Physician Assistant

## 2019-01-06 ENCOUNTER — Ambulatory Visit (INDEPENDENT_AMBULATORY_CARE_PROVIDER_SITE_OTHER): Payer: Managed Care, Other (non HMO) | Admitting: Physician Assistant

## 2019-01-06 VITALS — Wt 237.0 lb

## 2019-01-06 DIAGNOSIS — Z6837 Body mass index (BMI) 37.0-37.9, adult: Secondary | ICD-10-CM

## 2019-01-06 DIAGNOSIS — Z713 Dietary counseling and surveillance: Secondary | ICD-10-CM | POA: Diagnosis not present

## 2019-01-06 DIAGNOSIS — E66812 Obesity, class 2: Secondary | ICD-10-CM

## 2019-01-06 NOTE — Progress Notes (Signed)
       Patient: Amanda Ramsey Female    DOB: 11/01/1976   43 y.o.   MRN: [id number] Visit Date: 01/06/2019  Today's Provider: Margaretann Loveless, PA-C   Chief Complaint  Patient presents with  . Follow-up   Subjective:     HPI  Patient here today to be educated on how to use/administer Saxenda weight loss medication. She has no complaints.   No Known Allergies   Current Outpatient Medications:  .  amLODipine (NORVASC) 10 MG tablet, TAKE 1 TABLET BY MOUTH EVERY DAY, Disp: 90 tablet, Rfl: 0 .  Insulin Pen Needle (BD PEN NEEDLE NANO 2ND GEN) 32G X 4 MM MISC, To use with saxenda injections, Disp: 100 each, Rfl: 1 .  Liraglutide -Weight Management (SAXENDA) 18 MG/3ML SOPN, Inject 0.6 mg into the skin daily. Increase by 0.6mg  per week as tolerated until max dose of 3mg  daily achieved., Disp: 3 mL, Rfl: 3 .  phentermine (ADIPEX-P) 37.5 MG tablet, Take 1 tablet (37.5 mg total) by mouth daily before breakfast., Disp: 30 tablet, Rfl: 2 .  triamterene-hydrochlorothiazide (MAXZIDE) 75-50 MG tablet, TAKE 1 TABLET BY MOUTH EVERY DAY, Disp: 90 tablet, Rfl: 1 .  Vitamin D, Ergocalciferol, (DRISDOL) 50000 units CAPS capsule, TAKE ONE CAPSULE BY MOUTH EVERY 7 DAYS, Disp: 12 capsule, Rfl: 3  Review of Systems  Constitutional: Negative.   Respiratory: Negative.   Cardiovascular: Negative.   Gastrointestinal: Negative.   Neurological: Negative.   Psychiatric/Behavioral: Negative.     Social History   Tobacco Use  . Smoking status: Never Smoker  . Smokeless tobacco: Never Used  Substance Use Topics  . Alcohol use: Yes    Alcohol/week: 0.0 standard drinks    Comment: occasionally      Objective:   Wt 237 lb (107.5 kg)   BMI 37.12 kg/m  Vitals:   01/06/19 1229  Weight: 237 lb (107.5 kg)     Physical Exam Vitals signs reviewed.  Constitutional:      Appearance: Normal appearance. She is well-developed. She is obese.  HENT:     Head: Normocephalic and atraumatic.  Neck:     Musculoskeletal: Normal range of motion and neck supple.  Pulmonary:     Effort: Pulmonary effort is normal. No respiratory distress.  Neurological:     Mental Status: She is alert.  Psychiatric:        Behavior: Behavior normal.        Thought Content: Thought content normal.        Judgment: Judgment normal.         Assessment & Plan    1. Encounter for weight loss counseling Saxenda demonstration and 1st injection was done today in the office with patient. Answered all questions. Advised of adverse events and how to titrate medications. She is to call if she has any other questions or side effects.   2. Class 2 severe obesity due to excess calories with serious comorbidity and body mass index (BMI) of 37.0 to 37.9 in adult [facility]) See above medical treatment plan.     Margaretann Loveless, PA-C  [facility]

## 2019-01-23 ENCOUNTER — Telehealth: Payer: Self-pay

## 2019-01-24 NOTE — Telephone Encounter (Signed)
error 

## 2019-02-17 ENCOUNTER — Other Ambulatory Visit: Payer: Self-pay | Admitting: Physician Assistant

## 2019-02-17 DIAGNOSIS — I10 Essential (primary) hypertension: Secondary | ICD-10-CM

## 2019-02-18 ENCOUNTER — Other Ambulatory Visit: Payer: Self-pay | Admitting: Physician Assistant

## 2019-02-18 DIAGNOSIS — E559 Vitamin D deficiency, unspecified: Secondary | ICD-10-CM

## 2019-04-06 ENCOUNTER — Other Ambulatory Visit: Payer: Self-pay | Admitting: Physician Assistant

## 2019-04-06 DIAGNOSIS — I10 Essential (primary) hypertension: Secondary | ICD-10-CM

## 2019-05-20 ENCOUNTER — Other Ambulatory Visit: Payer: Self-pay | Admitting: Physician Assistant

## 2019-05-20 DIAGNOSIS — I10 Essential (primary) hypertension: Secondary | ICD-10-CM

## 2019-06-06 NOTE — Progress Notes (Signed)
Patient: Amanda Ramsey, Female    DOB: [birth year], 43 y.o.   MRN: [id number] Visit Date: 06/07/2019  Today's Provider: Margaretann LovelessJennifer M Burnette, PA-C   Chief Complaint  Patient presents with  . Annual Exam   Subjective:    Amanda RaringRasheeda A Deaton is a [age] y.o. female who presents today for health maintenance and biometric screen for work. She feels well. She reports exercising. She reports she is sleeping poorly.  -----------------------------------------------------------------   Review of Systems  Constitutional: Negative.   HENT: Negative.   Eyes: Negative.   Respiratory: Negative.   Cardiovascular: Negative.   Gastrointestinal: Negative.   Endocrine: Negative.   Genitourinary: Negative.   Musculoskeletal: Negative.   Skin: Negative.   Allergic/Immunologic: Negative.   Neurological: Negative.   Hematological: Negative.   Psychiatric/Behavioral: Negative.     Social History      She  reports that she has never smoked. She has never used smokeless tobacco. She reports current alcohol use. She reports that she does not use drugs.       Social History   Socioeconomic History  . Marital status: Single    Spouse name: Not on file  . Number of children: Not on file  . Years of education: Not on file  . Highest education level: Not on file  Occupational History  . Not on file  Social Needs  . Financial resource strain: Not on file  . Food insecurity    Worry: Not on file    Inability: Not on file  . Transportation needs    Medical: Not on file    Non-medical: Not on file  Tobacco Use  . Smoking status: Never Smoker  . Smokeless tobacco: Never Used  Substance and Sexual Activity  . Alcohol use: Yes    Alcohol/week: 0.0 standard drinks    Comment: occasionally  . Drug use: No  . Sexual activity: Not on file  Lifestyle  . Physical activity    Days per week: Not on file    Minutes per session: Not on file  . Stress: Not on file  Relationships  . Social  Musicianconnections    Talks on phone: Not on file    Gets together: Not on file    Attends religious service: Not on file    Active member of club or organization: Not on file    Attends meetings of clubs or organizations: Not on file    Relationship status: Not on file  Other Topics Concern  . Not on file  Social History Narrative  . Not on file    History reviewed. No pertinent past medical history.   Patient Active Problem List   Diagnosis Date Noted  . Class 2 severe obesity due to excess calories with serious comorbidity and body mass index (BMI) of 37.0 to 37.9 in adult (HCC) 04/08/2016  . Dizziness and giddiness 04/25/2015  . Accumulation of fluid in tissues 04/25/2015  . Gastritis, Helicobacter pylori 04/25/2015  . Decreased potassium in the blood 04/25/2015  . Borderline diabetes 04/25/2015  . Avitaminosis D 04/25/2015  . Abnormal ECG 06/01/2014  . Chest pain 06/01/2014  . Irregular bleeding 03/18/2009  . Adiposity 09/28/2003  . Essential (primary) hypertension 11/17/2001  . Hypercholesterolemia without hypertriglyceridemia 11/29/2000  . Family history of cardiovascular disease 11/09/1998    Past Surgical History:  Procedure Laterality Date  . ABDOMINAL HYSTERECTOMY  2011  . CHOLECYSTECTOMY      Family History  Family Status  Relation Name Status  . Mother  Alive  . Father  Deceased  . Sister  Alive  . Neg Hx  (Not Specified)        Her family history includes Alcohol abuse in her father; Cirrhosis in her father; Hypertension in her mother and sister; Seizures in her father. There is no history of Breast cancer.      No Known Allergies   Current Outpatient Medications:  .  amLODipine (NORVASC) 10 MG tablet, TAKE 1 TABLET BY MOUTH EVERY DAY, Disp: 90 tablet, Rfl: 1 .  Insulin Pen Needle (BD PEN NEEDLE NANO 2ND GEN) 32G X 4 MM MISC, To use with saxenda injections, Disp: 100 each, Rfl: 1 .  Liraglutide -Weight Management (SAXENDA) 18 MG/3ML SOPN, Inject  0.6 mg into the skin daily. Increase by 0.6mg  per week as tolerated until max dose of 3mg  daily achieved., Disp: 3 mL, Rfl: 3 .  triamterene-hydrochlorothiazide (MAXZIDE) 75-50 MG tablet, TAKE 1 TABLET BY MOUTH EVERY DAY, Disp: 90 tablet, Rfl: 1 .  Vitamin D, Ergocalciferol, (DRISDOL) 1.25 MG (50000 UT) CAPS capsule, TAKE ONE CAPSULE BY MOUTH EVERY 7 DAYS, Disp: 12 capsule, Rfl: 1   Patient Care Team: Reine JustBurnette, Jennifer M, PA-C as PCP - General (Physician Assistant)    Objective:    Vitals: BP (!) 138/96 (BP Location: Right Arm, Patient Position: Sitting, Cuff Size: Large)   Pulse 82   Temp 98.8 F (37.1 C) (Oral)   Resp 16   Wt 227 lb (103 kg)   BMI 35.55 kg/m    Vitals:   06/07/19 0817  BP: (!) 138/96  Pulse: 82  Resp: 16  Temp: 98.8 F (37.1 C)  TempSrc: Oral  Weight: 227 lb (103 kg)     Physical Exam Vitals signs reviewed.  Constitutional:      General: She is not in acute distress.    Appearance: Normal appearance. She is well-developed. She is obese. She is not diaphoretic.  HENT:     Head: Normocephalic and atraumatic.     Right Ear: Hearing, tympanic membrane, ear canal and external ear normal.     Left Ear: Hearing, tympanic membrane, ear canal and external ear normal.     Nose: Nose normal.     Mouth/Throat:     Mouth: Mucous membranes are moist.     Pharynx: Oropharynx is clear. Uvula midline. No oropharyngeal exudate.  Eyes:     General: No scleral icterus.       Right eye: No discharge.        Left eye: No discharge.     Extraocular Movements: Extraocular movements intact.     Conjunctiva/sclera: Conjunctivae normal.     Pupils: Pupils are equal, round, and reactive to light.  Neck:     Musculoskeletal: Normal range of motion and neck supple.     Thyroid: No thyromegaly.     Vascular: No carotid bruit or JVD.     Trachea: No tracheal deviation.  Cardiovascular:     Rate and Rhythm: Normal rate and regular rhythm.     Pulses: Normal pulses.      Heart sounds: Normal heart sounds. No murmur. No friction rub. No gallop.   Pulmonary:     Effort: Pulmonary effort is normal. No respiratory distress.     Breath sounds: Normal breath sounds. No wheezing or rales.  Chest:     Chest wall: No tenderness.     Breasts: Breasts are symmetrical.  Right: No inverted nipple, mass, nipple discharge, skin change or tenderness.        Left: No inverted nipple, mass, nipple discharge, skin change or tenderness.  Abdominal:     General: Bowel sounds are normal. There is no distension.     Palpations: Abdomen is soft. There is no mass.     Tenderness: There is no abdominal tenderness. There is no guarding or rebound.     Hernia: There is no hernia in the left inguinal area.  Genitourinary:    Exam position: Supine.     Labia:        Right: No rash, tenderness, lesion or injury.        Left: No rash, tenderness, lesion or injury.      Vagina: Normal. No signs of injury. No vaginal discharge, erythema, tenderness or bleeding.     Cervix: No cervical motion tenderness, discharge or friability.     Adnexa:        Right: No mass, tenderness or fullness.         Left: No mass, tenderness or fullness.       Rectum: Normal.     Comments: Uterus surgically absent, cervix still intact Musculoskeletal: Normal range of motion.        General: No tenderness.     Right lower leg: No edema.     Left lower leg: No edema.  Lymphadenopathy:     Cervical: No cervical adenopathy.  Skin:    General: Skin is warm and dry.     Capillary Refill: Capillary refill takes less than 2 seconds.     Findings: No rash.  Neurological:     General: No focal deficit present.     Mental Status: She is alert and oriented to person, place, and time. Mental status is at baseline.     Cranial Nerves: No cranial nerve deficit.     Motor: No weakness.     Coordination: Coordination normal.     Gait: Gait normal.     Deep Tendon Reflexes: Reflexes are normal and symmetric.   Psychiatric:        Mood and Affect: Mood normal.        Behavior: Behavior normal.        Thought Content: Thought content normal.        Judgment: Judgment normal.      Depression Screen PHQ 2/9 Scores 06/07/2019 05/20/2017 04/28/2017 04/21/2017  PHQ - 2 Score 0 1 3 4   PHQ- 9 Score - 5 11 11        Assessment & Plan:     Routine Health Maintenance and Physical Exam  Exercise Activities and Dietary recommendations Goals   None     Immunization History  Administered Date(s) Administered  . Td 03/18/2005  . Tdap 09/24/2011    Health Maintenance  Topic Date Due  . PAP SMEAR-Modifier  05/26/2019  . INFLUENZA VACCINE  06/10/2019  . TETANUS/TDAP  09/23/2021  . HIV Screening  Completed     Discussed health benefits of physical activity, and encouraged her to engage in regular exercise appropriate for her age and condition.    1. Annual physical exam Normal physical exam today. Will check labs as below and f/u pending lab results. If labs are stable and WNL she will not need to have these rechecked for one year at her next annual physical exam. She is to call the office in the meantime if she has any acute issue, questions or concerns. -  CBC w/Diff/Platelet - Comprehensive Metabolic Panel (CMET) - Lipid Profile - TSH - HgB A1c  2. Breast cancer screening Breast exam today was normal. There is no family history of breast cancer. She does perform regular self breast exams. Mammogram was ordered as below. [facility] was given to patient so she may schedule her mammogram at her convenience. - MM 3D SCREEN BREAST BILATERAL; Future  3. Cervical cancer screening Pap collected today. Will send as below and f/u pending results. - Pap IG and HPV (high risk) DNA detection  4. Encounter for biometric screening Will check labs as below and f/u pending results. - Comprehensive Metabolic Panel (CMET) - Lipid Profile - HgB A1c  5. 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 Discontinued and will avoid CCB. Will start Telmisartan as below. Call if having adverse effects or BP running high.  - telmisartan (MICARDIS) 80 MG tablet; Take 1 tablet (80 mg total) by mouth daily.  Dispense: 90 tablet; Refill: 1  6. Class 2 severe obesity due to excess calories with serious comorbidity and body mass index (BMI) of 35.0 to 35.9 in adult [facility](HCC) Counseled patient on healthy lifestyle modifications including dieting and exercise.  Patient has lost 10 pounds since February using Saxenda. Reports she stopped Saxenda for about a month in the beginning of the pandemic, but recently over the last 3 weeks started back.   --------------------------------------------------------------------    Margaretann LovelessJennifer M Burnette, PA-C  [facility]

## 2019-06-07 ENCOUNTER — Encounter: Payer: Self-pay | Admitting: Physician Assistant

## 2019-06-07 ENCOUNTER — Ambulatory Visit (INDEPENDENT_AMBULATORY_CARE_PROVIDER_SITE_OTHER): Payer: Managed Care, Other (non HMO) | Admitting: Physician Assistant

## 2019-06-07 ENCOUNTER — Other Ambulatory Visit: Payer: Self-pay

## 2019-06-07 VITALS — BP 138/96 | HR 82 | Temp 98.8°F | Resp 16 | Wt 227.0 lb

## 2019-06-07 DIAGNOSIS — Z124 Encounter for screening for malignant neoplasm of cervix: Secondary | ICD-10-CM

## 2019-06-07 DIAGNOSIS — Z0189 Encounter for other specified special examinations: Secondary | ICD-10-CM | POA: Diagnosis not present

## 2019-06-07 DIAGNOSIS — Z1239 Encounter for other screening for malignant neoplasm of breast: Secondary | ICD-10-CM | POA: Diagnosis not present

## 2019-06-07 DIAGNOSIS — Z6835 Body mass index (BMI) 35.0-35.9, adult: Secondary | ICD-10-CM

## 2019-06-07 DIAGNOSIS — Z008 Encounter for other general examination: Secondary | ICD-10-CM

## 2019-06-07 DIAGNOSIS — Z0000 Encounter for general adult medical examination without abnormal findings: Secondary | ICD-10-CM | POA: Diagnosis not present

## 2019-06-07 DIAGNOSIS — I10 Essential (primary) hypertension: Secondary | ICD-10-CM

## 2019-06-07 MED ORDER — TELMISARTAN 80 MG PO TABS: 80.0000 mg | ORAL_TABLET | Freq: Every day | ORAL | 1 refills | Status: DC

## 2019-06-07 NOTE — Patient Instructions (Addendum)
Health Maintenance, Female Adopting a healthy lifestyle and getting preventive care are important in promoting health and wellness. Ask your health care provider about:  The right schedule for you to have regular tests and exams.  Things you can do on your own to prevent diseases and keep yourself healthy. What should I know about diet, weight, and exercise? Eat a healthy diet   Eat a diet that includes plenty of vegetables, fruits, low-fat dairy products, and lean protein.  Do not eat a lot of foods that are high in solid fats, added sugars, or sodium. Maintain a healthy weight Body mass index (BMI) is used to identify weight problems. It estimates body fat based on height and weight. Your health care provider can help determine your BMI and help you achieve or maintain a healthy weight. Get regular exercise Get regular exercise. This is one of the most important things you can do for your health. Most adults should:  Exercise for at least 150 minutes each week. The exercise should increase your heart rate and make you sweat (moderate-intensity exercise).  Do strengthening exercises at least twice a week. This is in addition to the moderate-intensity exercise.  Spend less time sitting. Even light physical activity can be beneficial. Watch cholesterol and blood lipids Have your blood tested for lipids and cholesterol at 44 years of age, then have this test every 5 years. Have your cholesterol levels checked more often if:  Your lipid or cholesterol levels are high.  You are older than 44 years of age.  You are at high risk for heart disease. What should I know about cancer screening? Depending on your health history and family history, you may need to have cancer screening at various ages. This may include screening for:  Breast cancer.  Cervical cancer.  Colorectal cancer.  Skin cancer.  Lung cancer. What should I know about heart disease, diabetes, and high blood  pressure? Blood pressure and heart disease  High blood pressure causes heart disease and increases the risk of stroke. This is more likely to develop in people who have high blood pressure readings, are of African descent, or are overweight.  Have your blood pressure checked: ? Every 3-5 years if you are 18-39 years of age. ? Every year if you are 40 years old or older. Diabetes Have regular diabetes screenings. This checks your fasting blood sugar level. Have the screening done:  Once every three years after age 40 if you are at a normal weight and have a low risk for diabetes.  More often and at a younger age if you are overweight or have a high risk for diabetes. What should I know about preventing infection? Hepatitis B If you have a higher risk for hepatitis B, you should be screened for this virus. Talk with your health care provider to find out if you are at risk for hepatitis B infection. Hepatitis C Testing is recommended for:  Everyone born from 1945 through 1965.  Anyone with known risk factors for hepatitis C. Sexually transmitted infections (STIs)  Get screened for STIs, including gonorrhea and chlamydia, if: ? You are sexually active and are younger than 44 years of age. ? You are older than 44 years of age and your health care provider tells you that you are at risk for this type of infection. ? Your sexual activity has changed since you were last screened, and you are at increased risk for chlamydia or gonorrhea. Ask your health care provider if   you are at risk.  Ask your health care provider about whether you are at high risk for HIV. Your health care provider may recommend a prescription medicine to help prevent HIV infection. If you choose to take medicine to prevent HIV, you should first get tested for HIV. You should then be tested every 3 months for as long as you are taking the medicine. Pregnancy  If you are about to stop having your period (premenopausal) and  you may become pregnant, seek counseling before you get pregnant.  Take 400 to 800 micrograms (mcg) of folic acid every day if you become pregnant.  Ask for birth control (contraception) if you want to prevent pregnancy. Osteoporosis and menopause Osteoporosis is a disease in which the bones lose minerals and strength with aging. This can result in bone fractures. If you are 44 years old or older, or if you are at risk for osteoporosis and fractures, ask your health care provider if you should:  Be screened for bone loss.  Take a calcium or vitamin D supplement to lower your risk of fractures.  Be given hormone replacement therapy (HRT) to treat symptoms of menopause. Follow these instructions at home: Lifestyle  Do not use any products that contain nicotine or tobacco, such as cigarettes, e-cigarettes, and chewing tobacco. If you need help quitting, ask your health care provider.  Do not use street drugs.  Do not share needles.  Ask your health care provider for help if you need support or information about quitting drugs. Alcohol use  Do not drink alcohol if: ? Your health care provider tells you not to drink. ? You are pregnant, may be pregnant, or are planning to become pregnant.  If you drink alcohol: ? Limit how much you use to 0-1 drink a day. ? Limit intake if you are breastfeeding.  Be aware of how much alcohol is in your drink. In the [place] one drink equals one 12 oz bottle of beer (355 mL), one 5 oz glass of wine (148 mL), or one 1 oz glass of hard liquor (44 mL). General instructions  Schedule regular health, dental, and eye exams.  Stay current with your vaccines.  Tell your health care provider if: ? You often feel depressed. ? You have ever been abused or do not feel safe at home. Summary  Adopting a healthy lifestyle and getting preventive care are important in promoting health and wellness.  Follow your health care provider's instructions about healthy  diet, exercising, and getting tested or screened for diseases.  Follow your health care provider's instructions on monitoring your cholesterol and blood pressure. This information is not intended to replace advice given to you by your health care provider. Make sure you discuss any questions you have with your health care provider. Document Released: 05/11/2011 Document Revised: 10/19/2018 Document Reviewed: 10/19/2018 Elsevier Patient Education  2020 Elsevier Inc. Telmisartan tablets What is this medicine? TELMISARTAN (tel mi SAR tan) is used to treat high blood pressure. This medicine may be used for other purposes; ask your health care provider or pharmacist if you have questions. COMMON BRAND NAME(S): Micardis What should I tell my health care provider before I take this medicine? They need to know if you have any of these conditions:  if you are on a special diet, such as a low-salt diet  kidney or liver disease  an unusual or allergic reaction to telmisartan, other medicines, foods, dyes, or preservatives  pregnant or trying to get pregnant  breast-feeding  How should I use this medicine? Take this medicine by mouth with a glass of water. Follow the directions on the prescription label. This medicine can be taken with or without food. Take your doses at regular intervals. Do not take your medicine more often than directed. Talk to your pediatrician regarding the use of this medicine in children. Special care may be needed. Overdosage: If you think you have taken too much of this medicine contact a poison control center or emergency room at once. NOTE: This medicine is only for you. Do not share this medicine with others. What if I miss a dose? If you miss a dose, take it as soon as you can. If it is almost time for your next dose, take only that dose. Do not take double or extra doses. What may interact with this medicine?  digoxin  potassium salts or potassium  supplements  warfarin This list may not describe all possible interactions. Give your health care provider a list of all the medicines, herbs, non-prescription drugs, or dietary supplements you use. Also tell them if you smoke, drink alcohol, or use illegal drugs. Some items may interact with your medicine. What should I watch for while using this medicine? Visit your doctor or health care professional for regular checks on your progress. Check your blood pressure as directed. Ask your doctor or health care professional what your blood pressure should be and when you should contact him or her. Call your doctor or health care professional if you notice an irregular or fast heart beat. Women should inform their doctor if they wish to become pregnant or think they might be pregnant. There is a potential for serious side effects to an unborn child, particularly in the second or third trimester. Talk to your health care professional or pharmacist for more information. You may get drowsy or dizzy. Do not drive, use machinery, or do anything that needs mental alertness until you know how this drug affects you. Do not stand or sit up quickly, especially if you are an older patient. This reduces the risk of dizzy or fainting spells. Alcohol can make you more drowsy and dizzy. Avoid alcoholic drinks. Avoid salt substitutes unless you are told otherwise by your doctor or health care professional. Do not treat yourself for coughs, colds, or pain while you are taking this medicine without asking your doctor or health care professional for advice. Some ingredients may increase your blood pressure. What side effects may I notice from receiving this medicine? Side effects that you should report to your doctor or health care professional as soon as possible:  allergic reactions like skin rash, itching or hives, swelling of the face, lips, or tongue  breathing problems  dark urine  gout pain  muscle pains  slow  heartbeat  trouble passing urine or change in the amount of urine  unusual bleeding or bruising  yellowing of the eyes or skin Side effects that usually do not require medical attention (report to your doctor or health care professional if they continue or are bothersome):  back pain  change in sex drive or performance  diarrhea  sore throat or stuffy nose This list may not describe all possible side effects. Call your doctor for medical advice about side effects. You may report side effects to FDA at 1-800-FDA-1088. Where should I keep my medicine? Keep out of the reach of children. Store at room temperature between 15 and 30 degrees C (59 and 86 degrees F). Tablets should  not be removed from the blisters until right before use. Throw away any unused medicine after the expiration date. NOTE: This sheet is a summary. It may not cover all possible information. If you have questions about this medicine, talk to your doctor, pharmacist, or health care provider.  2020 Elsevier/Gold Standard (2008-01-11 13:39:10)

## 2019-06-08 ENCOUNTER — Telehealth: Payer: Self-pay

## 2019-06-08 LAB — CBC WITH DIFFERENTIAL/PLATELET
Basophils Absolute: 0 10*3/uL (ref 0.0–0.2)
Basos: 1 %
EOS (ABSOLUTE): 0.1 10*3/uL (ref 0.0–0.4)
Eos: 1 %
Hematocrit: 39.5 % (ref 34.0–46.6)
Hemoglobin: 12.8 g/dL (ref 11.1–15.9)
Immature Grans (Abs): 0 10*3/uL (ref 0.0–0.1)
Immature Granulocytes: 0 %
Lymphocytes Absolute: 2.2 10*3/uL (ref 0.7–3.1)
Lymphs: 38 %
MCH: 24.8 pg — ABNORMAL LOW (ref 26.6–33.0)
MCHC: 32.4 g/dL (ref 31.5–35.7)
MCV: 76 fL — ABNORMAL LOW (ref 79–97)
Monocytes Absolute: 0.3 10*3/uL (ref 0.1–0.9)
Monocytes: 5 %
Neutrophils Absolute: 3.1 10*3/uL (ref 1.4–7.0)
Neutrophils: 55 %
Platelets: 380 10*3/uL (ref 150–450)
RBC: 5.17 x10E6/uL (ref 3.77–5.28)
RDW: 13 % (ref 11.7–15.4)
WBC: 5.6 10*3/uL (ref 3.4–10.8)

## 2019-06-08 LAB — LIPID PANEL
Chol/HDL Ratio: 3.7 ratio (ref 0.0–4.4)
Cholesterol, Total: 200 mg/dL — ABNORMAL HIGH (ref 100–199)
HDL: 54 mg/dL (ref >39)
LDL Calculated: 133 mg/dL — ABNORMAL HIGH (ref 0–99)
Triglycerides: 63 mg/dL (ref 0–149)
VLDL Cholesterol Cal: 13 mg/dL (ref 5–40)

## 2019-06-08 LAB — COMPREHENSIVE METABOLIC PANEL
ALT: 33 IU/L — ABNORMAL HIGH (ref 0–32)
AST: 20 IU/L (ref 0–40)
Albumin/Globulin Ratio: 1.2 (ref 1.2–2.2)
Albumin: 4.4 g/dL (ref 3.8–4.8)
Alkaline Phosphatase: 44 IU/L (ref 39–117)
BUN/Creatinine Ratio: 9 (ref 9–23)
BUN: 8 mg/dL (ref 6–24)
Bilirubin Total: 0.5 mg/dL (ref 0.0–1.2)
CO2: 25 mmol/L (ref 20–29)
Calcium: 9.3 mg/dL (ref 8.7–10.2)
Chloride: 98 mmol/L (ref 96–106)
Creatinine, Ser: 0.86 mg/dL (ref 0.57–1.00)
GFR calc Af Amer: 96 mL/min/{1.73_m2} (ref >59)
GFR calc non Af Amer: 83 mL/min/{1.73_m2} (ref >59)
Globulin, Total: 3.7 g/dL (ref 1.5–4.5)
Glucose: 86 mg/dL (ref 65–99)
Potassium: 3.4 mmol/L — ABNORMAL LOW (ref 3.5–5.2)
Sodium: 143 mmol/L (ref 134–144)
Total Protein: 8.1 g/dL (ref 6.0–8.5)

## 2019-06-08 LAB — HEMOGLOBIN A1C
Est. average glucose Bld gHb Est-mCnc: 114 mg/dL
Hgb A1c MFr Bld: 5.6 % (ref 4.8–5.6)

## 2019-06-08 LAB — TSH: TSH: 3.9 u[IU]/mL (ref 0.450–4.500)

## 2019-06-08 NOTE — Telephone Encounter (Signed)
-----   Message from Mar Daring, PA-C sent at 06/08/2019 10:08 AM EDT ----- Blood count is normal. Kidney function is normal. Liver enzymes are normal. Potassium is borderline low, increase potassium rich foods such as bananas, sweet potatoes, spinach, broccoli, etc. Sodium and calcium are normal. Cholesterol improved from last year and is lowest it has been in 2-3 years. Thyroid is normal. A1c is normal, down from 5.9 to 5.6. Keep up healthy lifestyle modifications.

## 2019-06-08 NOTE — Telephone Encounter (Signed)
Patient advised as directed below. 

## 2019-06-13 ENCOUNTER — Telehealth: Payer: Self-pay

## 2019-06-13 DIAGNOSIS — R87618 Other abnormal cytological findings on specimens from cervix uteri: Secondary | ICD-10-CM

## 2019-06-13 DIAGNOSIS — R8789 Other abnormal findings in specimens from female genital organs: Secondary | ICD-10-CM

## 2019-06-13 LAB — PAP IG AND HPV HIGH-RISK: PAP Smear Comment: 0

## 2019-06-13 LAB — HPV, LOW VOLUME (REFLEX): HPV low volume reflex: POSITIVE — AB

## 2019-06-13 NOTE — Telephone Encounter (Signed)
Also spoke with patient and answered all questions.

## 2019-06-13 NOTE — Telephone Encounter (Signed)
Patient notified of results. She would like to be referred to [facility]. Patient would like you to call her, she has some questions about her results.

## 2019-06-13 NOTE — Telephone Encounter (Signed)
Referral placed.

## 2019-06-13 NOTE — Telephone Encounter (Signed)
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 I would recommend GYN referral.

## 2019-06-19 ENCOUNTER — Telehealth: Payer: Self-pay | Admitting: Obstetrics and Gynecology

## 2019-06-19 NOTE — Telephone Encounter (Signed)
[facility] referring for Pap smear abnormality of cervix/human papillomavirus (HPV) positive. Called and left voicemail for patient to call back to be schedule

## 2019-06-20 NOTE — Telephone Encounter (Signed)
Patient scheduled for colpo 9/4 with [facility] at 1:50.

## 2019-07-14 ENCOUNTER — Other Ambulatory Visit: Payer: Self-pay

## 2019-07-14 ENCOUNTER — Encounter: Payer: Self-pay | Admitting: Obstetrics and Gynecology

## 2019-07-14 ENCOUNTER — Ambulatory Visit (INDEPENDENT_AMBULATORY_CARE_PROVIDER_SITE_OTHER): Payer: Managed Care, Other (non HMO) | Admitting: Obstetrics and Gynecology

## 2019-07-14 VITALS — BP 124/80 | Ht 67.0 in | Wt 229.0 lb

## 2019-07-14 DIAGNOSIS — B977 Papillomavirus as the cause of diseases classified elsewhere: Secondary | ICD-10-CM | POA: Diagnosis not present

## 2019-07-14 DIAGNOSIS — R8789 Other abnormal findings in specimens from female genital organs: Secondary | ICD-10-CM

## 2019-07-14 DIAGNOSIS — R87618 Other abnormal cytological findings on specimens from cervix uteri: Secondary | ICD-10-CM

## 2019-07-14 NOTE — Progress Notes (Signed)
Patient ID: Amanda Ramsey, female   DOB: [birth year], 43 y.o.   MRN: [id number]  Reason for Consult: Referral (Had abnormal ones when she was younger, No abnormal ones since hysterectomy )   Referred by Margaretann LovelessBurnette, Jennifer M, P*  Subjective:     HPI:  Amanda Ramsey is a 43 y.o. female. She presents today for a consultation regarding colposcopy. She recently had a vaginal pap smear which was NIL HPV+. She has not had typing of the HPV virus. She reports that she had a total hysterectomy in 2011 for abnormal uterine bleeding related to a fibroid uterus. She denies having a history of cervical abnormalities. There are not other pap smear records available for comparison.   Past Medical History:  Diagnosis Date  . Anemia   . Gallstones   . Hypertension    Family History  Problem Relation Age of Onset  . Hypertension Mother   . Alcohol abuse Father   . Cirrhosis Father   . Seizures Father   . Hypertension Sister   . Breast cancer Neg Hx    Past Surgical History:  Procedure Laterality Date  . ABDOMINAL HYSTERECTOMY  2011  . CHOLECYSTECTOMY      Short Social History:  Social History   Tobacco Use  . Smoking status: Never Smoker  . Smokeless tobacco: Never Used  Substance Use Topics  . Alcohol use: Yes    Alcohol/week: 0.0 standard drinks    Comment: occasionally    No Known Allergies  Current Outpatient Medications  Medication Sig Dispense Refill  . telmisartan (MICARDIS) 80 MG tablet Take 1 tablet (80 mg total) by mouth daily. 90 tablet 1  . triamterene-hydrochlorothiazide (MAXZIDE) 75-50 MG tablet TAKE 1 TABLET BY MOUTH EVERY DAY 90 tablet 1  . Vitamin D, Ergocalciferol, (DRISDOL) 1.25 MG (50000 UT) CAPS capsule TAKE ONE CAPSULE BY MOUTH EVERY 7 DAYS 12 capsule 1  . Insulin Pen Needle (BD PEN NEEDLE NANO 2ND GEN) 32G X 4 MM MISC To use with saxenda injections (Patient not taking: Reported on 07/14/2019) 100 each 1  . Liraglutide -Weight Management (SAXENDA) 18  MG/3ML SOPN Inject 0.6 mg into the skin daily. Increase by 0.6mg  per week as tolerated until max dose of 3mg  daily achieved. (Patient not taking: Reported on 07/14/2019) 3 mL 3   No current facility-administered medications for this visit.     Review of Systems  Constitutional: Negative for chills, fatigue, fever and unexpected weight change.  HENT: Negative for trouble swallowing.  Eyes: Negative for loss of vision.  Respiratory: Negative for cough, shortness of breath and wheezing.  Cardiovascular: Negative for chest pain, leg swelling, palpitations and syncope.  GI: Negative for abdominal pain, blood in stool, diarrhea, nausea and vomiting.  GU: Negative for difficulty urinating, dysuria, frequency and hematuria.  Musculoskeletal: Negative for back pain, leg pain and joint pain.  Skin: Negative for rash.  Neurological: Negative for dizziness, headaches, light-headedness, numbness and seizures.  Psychiatric: Negative for behavioral problem, confusion, depressed mood and sleep disturbance.        Objective:  Objective   Vitals:   07/14/19 1352  BP: 124/80  Weight: 229 lb (103.9 kg)  Height: 5\' 7"  (1.702 m)   Body mass index is 35.87 kg/m.  Physical Exam Vitals signs and nursing note reviewed.  Constitutional:      Appearance: She is well-developed.  HENT:     Head: Normocephalic and atraumatic.  Eyes:     Pupils: Pupils are equal, round,  and reactive to light.  Cardiovascular:     Rate and Rhythm: Normal rate and regular rhythm.  Pulmonary:     Effort: Pulmonary effort is normal. No respiratory distress.  Skin:    General: Skin is warm and dry.  Neurological:     Mental Status: She is alert and oriented to person, place, and time.  Psychiatric:        Behavior: Behavior normal.        Thought Content: Thought content normal.        Judgment: Judgment normal.        Assessment/Plan:     43 yo G0P0000 NIL HPV+ vaginal pap.  Reviewed options with patient for  monitoring or colposcopy.  She elected to repeat the pap smear in 1 year with co-testing to identify and type HPV viral type   More than 15 minutes were spent face to face with the patient in the room with more than 50% of the time spent providing counseling and discussing the plan of management.    Adrian Prows MD [facility] 07/16/2019 10:52 PM

## 2019-09-14 ENCOUNTER — Ambulatory Visit
Admission: RE | Admit: 2019-09-14 | Discharge: 2019-09-14 | Disposition: A | Discharge status: Home / Self Care | Payer: Managed Care, Other (non HMO) | Source: Ambulatory Visit | Attending: Physician Assistant | Admitting: Physician Assistant

## 2019-09-14 DIAGNOSIS — Z1231 Encounter for screening mammogram for malignant neoplasm of breast: Secondary | ICD-10-CM | POA: Insufficient documentation

## 2019-09-14 DIAGNOSIS — Z1239 Encounter for other screening for malignant neoplasm of breast: Secondary | ICD-10-CM

## 2019-09-18 ENCOUNTER — Telehealth: Payer: Self-pay

## 2019-09-18 NOTE — Telephone Encounter (Signed)
Patient advised as directed below. 

## 2019-09-18 NOTE — Telephone Encounter (Signed)
-----   Message from Jennifer M Burnette, PA-C sent at 09/18/2019  9:17 AM EST ----- Normal mammogram. Repeat screening in one year. 

## 2019-09-28 ENCOUNTER — Other Ambulatory Visit: Payer: Self-pay | Admitting: Physician Assistant

## 2019-09-28 DIAGNOSIS — I10 Essential (primary) hypertension: Secondary | ICD-10-CM

## 2019-10-20 ENCOUNTER — Ambulatory Visit: Payer: Self-pay | Admitting: Physician Assistant

## 2019-10-20 DIAGNOSIS — H8110 Benign paroxysmal vertigo, unspecified ear: Secondary | ICD-10-CM

## 2019-10-20 MED ORDER — MECLIZINE HCL 25 MG PO TABS: 25.0000 mg | ORAL_TABLET | Freq: Three times a day (TID) | ORAL | 0 refills | Status: DC | PRN

## 2019-10-20 NOTE — Telephone Encounter (Signed)
Sounds like BPPV. I can send in meclizine. She should also do flonase, two sprays each nostril daily. Meclizine can make drowsy. Also can look up epley maneuver or brandt-daroff exercises to help. These will cause dizziness during head movements but hopefully will cure.

## 2019-10-20 NOTE — Telephone Encounter (Signed)
Pt reports "Feeling off past 3 days." States lightheaded, not spinning, with turning head, movement. States had BP checked at workplace, 128/100, wrist cuff, friends monitor. HR 68. Denies headache, no visual changes, no weakness. States "I feel a little better today". On BP meds, no missed doses, taking as ordered. CAll transferred to practice, Helene Kelp, no availability with any provider. Pt declined UC. Assured NT would route to practiced for PCP's review. Advised ED for worsening of symptoms, severe headache, weakness, advised to monitor BP.  Pt verbalizes understanding. CB# [id number]  Reason for Disposition . Systolic BP  >= 993 OR Diastolic >= 716    Pt is lightheaded  Additional Information . Commented on: Systolic BP  >= 967 OR Diastolic >= 893    Lightheaded  Answer Assessment - Initial Assessment Questions 1. BLOOD PRESSURE: "What is the blood pressure?" "Did you take at least two measurements 5 minutes apart?"     128/100 2. ONSET: "When did you take your blood pressure?"    "Minutes ago" 3. HOW: "How did you obtain the blood pressure?" (e.g., visiting nurse, automatic home BP monitor)    Wrist cuff, friends monitor 4. HISTORY: "Do you have a history of high blood pressure?"    yes 5. MEDICATIONS: "Are you taking any medications for blood pressure?" "Have you missed any doses recently?"    No missed doses 6. OTHER SYMPTOMS: "Do you have any symptoms?" (e.g., headache, chest pain, blurred vision, difficulty breathing, weakness)    Lightheaded with turning head  Protocols used: HIGH BLOOD PRESSURE-A-AH

## 2019-10-20 NOTE — Telephone Encounter (Signed)
Patient advised as directed below. 
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
## 2019-10-20 NOTE — Telephone Encounter (Signed)
From [facility] 

## 2019-12-12 ENCOUNTER — Other Ambulatory Visit: Payer: Self-pay | Admitting: Physician Assistant

## 2019-12-12 DIAGNOSIS — I10 Essential (primary) hypertension: Secondary | ICD-10-CM

## 2019-12-14 ENCOUNTER — Other Ambulatory Visit: Payer: Self-pay

## 2019-12-14 ENCOUNTER — Ambulatory Visit (INDEPENDENT_AMBULATORY_CARE_PROVIDER_SITE_OTHER): Payer: Managed Care, Other (non HMO) | Admitting: Physician Assistant

## 2019-12-14 ENCOUNTER — Encounter: Payer: Self-pay | Admitting: Physician Assistant

## 2019-12-14 DIAGNOSIS — Z6837 Body mass index (BMI) 37.0-37.9, adult: Secondary | ICD-10-CM | POA: Diagnosis not present

## 2019-12-14 DIAGNOSIS — I10 Essential (primary) hypertension: Secondary | ICD-10-CM | POA: Diagnosis not present

## 2019-12-14 DIAGNOSIS — E66812 Obesity, class 2: Secondary | ICD-10-CM

## 2019-12-14 DIAGNOSIS — Z713 Dietary counseling and surveillance: Secondary | ICD-10-CM

## 2019-12-14 MED ORDER — BD PEN NEEDLE NANO 2ND GEN 32G X 4 MM MISC: 1 refills | Status: DC

## 2019-12-14 MED ORDER — TELMISARTAN 80 MG PO TABS: 80.0000 mg | ORAL_TABLET | Freq: Every day | ORAL | 1 refills | Status: DC

## 2019-12-14 MED ORDER — SAXENDA 18 MG/3ML ~~LOC~~ SOPN: 0.6000 mg | PEN_INJECTOR | Freq: Every day | SUBCUTANEOUS | 3 refills | Status: DC

## 2019-12-14 NOTE — Progress Notes (Signed)
Patient: Amanda Ramsey Female    DOB: May 08, 1976   44 y.o.   MRN: [id number] Visit Date: 12/14/2019  Today's Provider: Mar Daring, PA-C   No chief complaint on file.  Subjective:     Virtual Visit via Telephone Note  I connected with Amanda Ramsey on 12/14/19 at  8:00 AM EST by telephone and verified that I am speaking with the correct person using two identifiers.  Location: Patient: Home Provider: [facility]   I discussed the limitations, risks, security and privacy concerns of performing an evaluation and management service by telephone and the availability of in person appointments. I also discussed with the patient that there may be a patient responsible charge related to this service. The patient expressed understanding and agreed to proceed.  HPI  Amanda Ramsey is a 44 yr old female that presents today via telephone visit for medication refills. Overall she feels well. She is needing a refill on her Saxenda for medical weight loss and telmisartan.   No Known Allergies   Current Outpatient Medications:  .  Insulin Pen Needle (BD PEN NEEDLE NANO 2ND GEN) 32G X 4 MM MISC, To use with saxenda injections (Patient not taking: Reported on 07/14/2019), Disp: 100 each, Rfl: 1 .  Liraglutide -Weight Management (SAXENDA) 18 MG/3ML SOPN, Inject 0.6 mg into the skin daily. Increase by 0.6mg  per week as tolerated until max dose of 3mg  daily achieved. (Patient not taking: Reported on 07/14/2019), Disp: 3 mL, Rfl: 3 .  meclizine (ANTIVERT) 25 MG tablet, Take 1 tablet (25 mg total) by mouth 3 (three) times daily as needed for dizziness., Disp: 30 tablet, Rfl: 0 .  telmisartan (MICARDIS) 80 MG tablet, Take 1 tablet (80 mg total) by mouth daily., Disp: 90 tablet, Rfl: 1 .  triamterene-hydrochlorothiazide (MAXZIDE) 75-50 MG tablet, TAKE 1 TABLET BY MOUTH EVERY DAY, Disp: 90 tablet, Rfl: 1 .  Vitamin D, Ergocalciferol, (DRISDOL) 1.25 MG (50000 UT) CAPS capsule, TAKE ONE CAPSULE BY  MOUTH EVERY 7 DAYS, Disp: 12 capsule, Rfl: 1  Review of Systems  Constitutional: Negative.   Respiratory: Negative.   Cardiovascular: Negative.   Neurological: Negative.     Social History   Tobacco Use  . Smoking status: Ramsey Smoker  . Smokeless tobacco: Ramsey Used  Substance Use Topics  . Alcohol use: Yes    Alcohol/week: 0.0 standard drinks    Comment: occasionally      Objective:   There were no vitals taken for this visit. There were no vitals filed for this visit.There is no height or weight on file to calculate BMI.   Physical Exam Vitals reviewed.  Constitutional:      General: She is not in acute distress. Pulmonary:     Effort: No respiratory distress.  Neurological:     Mental Status: She is alert.  Psychiatric:        Mood and Affect: Mood normal.        Thought Content: Thought content normal.      No results found for any visits on 12/14/19.     Assessment & Plan     1. Essential hypertension Stable. Diagnosis pulled for medication refill. Continue current medical treatment plan. - telmisartan (MICARDIS) 80 MG tablet; Take 1 tablet (80 mg total) by mouth daily.  Dispense: 90 tablet; Refill: 1  2. Encounter for weight loss counseling Stable. Diagnosis pulled for medication refill. Continue current medical treatment plan. - Liraglutide -Weight Management (SAXENDA) 18  MG/3ML SOPN; Inject 0.6 mg into the skin daily. Increase by 0.6mg  per week as tolerated until max dose of 3mg  daily achieved.  Dispense: 3 mL; Refill: 3 - Insulin Pen Needle (BD PEN NEEDLE NANO 2ND GEN) 32G X 4 MM MISC; To use with saxenda injections  Dispense: 100 each; Refill: 1  3. Class 2 severe obesity due to excess calories with serious comorbidity and body mass index (BMI) of 37.0 to 37.9 in adult (HCC) Stable. Diagnosis pulled for medication refill. Continue current medical treatment plan. - Liraglutide -Weight Management (SAXENDA) 18 MG/3ML SOPN; Inject 0.6 mg into the skin  daily. Increase by 0.6mg  per week as tolerated until max dose of 3mg  daily achieved.  Dispense: 3 mL; Refill: 3 - Insulin Pen Needle (BD PEN NEEDLE NANO 2ND GEN) 32G X 4 MM MISC; To use with saxenda injections  Dispense: 100 each; Refill: 1 I discussed the assessment and treatment plan with the patient. The patient was provided an opportunity to ask questions and all were answered. The patient agreed with the plan and demonstrated an understanding of the instructions.   The patient was advised to call back or seek an in-person evaluation if the symptoms worsen or if the condition fails to improve as anticipated.  I provided 10 minutes of non-face-to-face time during this encounter.      , PA-C  [facility]

## 2020-02-20 ENCOUNTER — Ambulatory Visit (INDEPENDENT_AMBULATORY_CARE_PROVIDER_SITE_OTHER): Payer: Managed Care, Other (non HMO) | Admitting: Physician Assistant

## 2020-02-20 DIAGNOSIS — J029 Acute pharyngitis, unspecified: Secondary | ICD-10-CM | POA: Diagnosis not present

## 2020-02-20 MED ORDER — PREDNISONE 20 MG PO TABS: 20.0000 mg | ORAL_TABLET | Freq: Every day | ORAL | 0 refills | Status: DC

## 2020-02-20 MED ORDER — AMOXICILLIN 875 MG PO TABS: 875.0000 mg | ORAL_TABLET | Freq: Two times a day (BID) | ORAL | 0 refills | Status: AC

## 2020-02-20 NOTE — Progress Notes (Signed)
Virtual telephone visit      Virtual Visit via Telephone Note   This visit type was conducted due to national recommendations for restrictions regarding the COVID-19 Pandemic (e.g. social distancing) in an effort to limit this patient's exposure and mitigate transmission in our community. Due to her co-morbid illnesses, this patient is at least at moderate risk for complications without adequate follow up. This format is felt to be most appropriate for this patient at this time. The patient did not have access to video technology or had technical difficulties with video requiring transitioning to audio format only (telephone). Physical exam was limited to content and character of the telephone converstion.    Patient location: Home Provider location: Office    Patient: Amanda Ramsey   DOB: 06-Aug-1976   44 y.o. Female  MRN: [id number] Visit Date: 02/20/2020  Today's Provider: Trey Sailors, PA-C  Subjective:    Chief Complaint  Patient presents with  . Sore Throat   Sore Throat  This is a new problem. The current episode started yesterday. The problem has been gradually worsening. Neither side of throat is experiencing more pain than the other. There has been no fever. Associated symptoms include swollen glands and trouble swallowing. Pertinent negatives include no congestion, coughing, ear pain, headaches, neck pain, shortness of breath or stridor. She has tried cool liquids for the symptoms. The treatment provided mild relief.   Woke up yesterday morning with sore throat that has been worsening. Denies fevers and chills. Denies sick contacts. Denies SOB. Denies cough. Able to swallow saliva. Denies drooling.     Patient Active Problem List   Diagnosis Date Noted  . Class 2 severe obesity due to excess calories with serious comorbidity and body mass index (BMI) of 37.0 to 37.9 in adult (HCC) 04/08/2016  . Dizziness and giddiness 04/25/2015  . Accumulation of fluid in  tissues 04/25/2015  . Gastritis, Helicobacter pylori 04/25/2015  . Decreased potassium in the blood 04/25/2015  . Borderline diabetes 04/25/2015  . Avitaminosis D 04/25/2015  . Abnormal ECG 06/01/2014  . Chest pain 06/01/2014  . Irregular bleeding 03/18/2009  . Adiposity 09/28/2003  . Essential (primary) hypertension 11/17/2001  . Hypercholesterolemia without hypertriglyceridemia 11/29/2000  . Family history of cardiovascular disease 11/09/1998   Past Surgical History:  Procedure Laterality Date  . ABDOMINAL HYSTERECTOMY  2011  . CHOLECYSTECTOMY     Social History   Socioeconomic History  . Marital status: Single    Spouse name: Not on file  . Number of children: Not on file  . Years of education: Not on file  . Highest education level: Not on file  Occupational History  . Not on file  Tobacco Use  . Smoking status: Never Smoker  . Smokeless tobacco: Never Used  Substance and Sexual Activity  . Alcohol use: Yes    Alcohol/week: 0.0 standard drinks    Comment: occasionally  . Drug use: No  . Sexual activity: Not Currently    Birth control/protection: Surgical  Other Topics Concern  . Not on file  Social History Narrative  . Not on file   Social Determinants of Health   Financial Resource Strain:   . Difficulty of Paying Living Expenses:   Food Insecurity:   . Worried About Programme researcher, broadcasting/film/video in the Last Year:   . Barista in the Last Year:   Transportation Needs:   . Freight forwarder (Medical):   Marland Kitchen Lack of Transportation (Non-Medical):  Physical Activity:   . Days of Exercise per Week:   . Minutes of Exercise per Session:   Stress:   . Feeling of Stress :   Social Connections:   . Frequency of Communication with Friends and Family:   . Frequency of Social Gatherings with Friends and Family:   . Attends Religious Services:   . Active Member of Clubs or Organizations:   . Attends Archivist Meetings:   Marland Kitchen Marital Status:   Intimate  Partner Violence:   . Fear of Current or Ex-Partner:   . Emotionally Abused:   Marland Kitchen Physically Abused:   . Sexually Abused:    No Known Allergies    Medications: Outpatient Medications Prior to Visit  Medication Sig  . Insulin Pen Needle (BD PEN NEEDLE NANO 2ND GEN) 32G X 4 MM MISC To use with saxenda injections  . Liraglutide -Weight Management (SAXENDA) 18 MG/3ML SOPN Inject 0.6 mg into the skin daily. Increase by 0.6mg  per week as tolerated until max dose of 3mg  daily achieved.  . meclizine (ANTIVERT) 25 MG tablet Take 1 tablet (25 mg total) by mouth 3 (three) times daily as needed for dizziness.  Marland Kitchen telmisartan (MICARDIS) 80 MG tablet Take 1 tablet (80 mg total) by mouth daily.  Marland Kitchen triamterene-hydrochlorothiazide (MAXZIDE) 75-50 MG tablet TAKE 1 TABLET BY MOUTH EVERY DAY  . Vitamin D, Ergocalciferol, (DRISDOL) 1.25 MG (50000 UT) CAPS capsule TAKE ONE CAPSULE BY MOUTH EVERY 7 DAYS   No facility-administered medications prior to visit.    Review of Systems  Constitutional: Negative.   HENT: Positive for sore throat and trouble swallowing. Negative for congestion and ear pain.   Eyes: Negative.   Respiratory: Negative.  Negative for cough, shortness of breath and stridor.   Cardiovascular: Negative.   Gastrointestinal: Negative.   Endocrine: Negative.   Genitourinary: Negative.   Musculoskeletal: Negative.  Negative for neck pain.  Skin: Negative.   Allergic/Immunologic: Negative.   Neurological: Negative.  Negative for headaches.  Hematological: Negative.   Psychiatric/Behavioral: Negative.         Objective:    There were no vitals taken for this visit.       Assessment & Plan:    1. Sore throat  - symptoms and exam c/w viral URI  - concern for possible COVID19 infection -explained that she should get tested for COVID infection. She would like to do this at local pharmacy. If negative and still feeling poor, may start abx. If positive, do not start antibiotics. She  may start the prednisone, however, today.  - will send for outpatient testing - discussed need to quarantine 10 days from start of symptoms and until fever-free for at least 48 hours - discussed need to quarantine household members - discussed symptomatic management, natural course, and return precautions  - amoxicillin (AMOXIL) 875 MG tablet; Take 1 tablet (875 mg total) by mouth 2 (two) times daily for 7 days.  Dispense: 14 tablet; Refill: 0 - predniSONE (DELTASONE) 20 MG tablet; Take 1 tablet (20 mg total) by mouth daily with breakfast.  Dispense: 5 tablet; Refill: 0     I discussed the assessment and treatment plan with the patient. The patient was provided an opportunity to ask questions and all were answered. The patient agreed with the plan and demonstrated an understanding of the instructions.   The patient was advised to call back or seek an in-person evaluation if the symptoms worsen or if the condition fails to improve as anticipated.  I provided 15 minutes of non-face-to-face time during this encounter.   Maryella Shivers  [facility] [phone] (phone) [phone] (fax)  [facility]

## 2020-04-04 ENCOUNTER — Other Ambulatory Visit: Payer: Self-pay | Admitting: Physician Assistant

## 2020-04-04 DIAGNOSIS — I10 Essential (primary) hypertension: Secondary | ICD-10-CM

## 2020-04-04 NOTE — Telephone Encounter (Signed)
Requested medication (s) are due for refill today: {yes  Requested medication (s) are on the active medication list: yes  Last refill: 09/28/19   Patient reports not taking  Future visit scheduled: No  Notes to clinic: med list has medication listed with note patient reports not taking  Not listed on office note 3 months ago. last K 3.4 06/07/19.    Requested Prescriptions  Pending Prescriptions Disp Refills   triamterene-hydrochlorothiazide (MAXZIDE) 75-50 MG tablet [Pharmacy Med Name: TRIAMTERENE 75MG / HCTZ 50MG  TABLETS] 90 tablet 1    Sig: TAKE 1 TABLET BY MOUTH EVERY DAY      Cardiovascular: Diuretic Combos Failed - 04/04/2020 11:05 AM      Failed - K in normal range and within 360 days    Potassium  Date Value Ref Range Status  06/07/2019 3.4 (L) 3.5 - 5.2 mmol/L Final          Failed - Valid encounter within last 6 months    Recent Outpatient Visits           1 month ago Sore throat   [facility] 06/09/2019 M, PA-C   3 months ago Essential hypertension   [facility] M M, Joycelyn Man   10 months ago Annual physical exam   [facility] [place], [facility] M, Victorino Dike   1 year ago Encounter for weight loss counseling   [facility] [place], [facility] M, Victorino Dike   1 year ago Encounter for weight loss counseling   [facility] [place] M, Joycelyn Man              Passed - Na in normal range and within 360 days    Sodium  Date Value Ref Range Status  06/07/2019 143 134 - 144 mmol/L Final          Passed - Cr in normal range and within 360 days    Creatinine, Ser  Date Value Ref Range Status  06/07/2019 0.86 0.57 - 1.00 mg/dL Final          Passed - Ca in normal range and within 360 days    Calcium  Date Value Ref Range Status  06/07/2019 9.3 8.7 - 10.2 mg/dL Final          Passed - Last BP in normal range    BP Readings from Last 1 Encounters:  07/14/19 124/80

## 2020-05-27 ENCOUNTER — Other Ambulatory Visit: Payer: Self-pay

## 2020-05-27 ENCOUNTER — Ambulatory Visit: Payer: Managed Care, Other (non HMO) | Admitting: Physician Assistant

## 2020-05-27 ENCOUNTER — Encounter: Payer: Self-pay | Admitting: Physician Assistant

## 2020-05-27 VITALS — BP 121/89 | HR 81 | Temp 98.1°F | Resp 16 | Ht 67.0 in | Wt 234.8 lb

## 2020-05-27 DIAGNOSIS — Z713 Dietary counseling and surveillance: Secondary | ICD-10-CM | POA: Diagnosis not present

## 2020-05-27 DIAGNOSIS — E559 Vitamin D deficiency, unspecified: Secondary | ICD-10-CM | POA: Diagnosis not present

## 2020-05-27 DIAGNOSIS — Z008 Encounter for other general examination: Secondary | ICD-10-CM | POA: Diagnosis not present

## 2020-05-27 DIAGNOSIS — Z6836 Body mass index (BMI) 36.0-36.9, adult: Secondary | ICD-10-CM

## 2020-05-27 MED ORDER — WEGOVY 1 MG/0.5ML ~~LOC~~ SOAJ: 1.0000 mg | SUBCUTANEOUS | 0 refills | Status: DC

## 2020-05-27 MED ORDER — VITAMIN D (ERGOCALCIFEROL) 1.25 MG (50000 UNIT) PO CAPS: ORAL_CAPSULE | ORAL | 1 refills | Status: DC

## 2020-05-27 NOTE — Patient Instructions (Signed)
Health Maintenance, Female Adopting a healthy lifestyle and getting preventive care are important in promoting health and wellness. Ask your health care provider about:  The right schedule for you to have regular tests and exams.  Things you can do on your own to prevent diseases and keep yourself healthy. What should I know about diet, weight, and exercise? Eat a healthy diet   Eat a diet that includes plenty of vegetables, fruits, low-fat dairy products, and lean protein.  Do not eat a lot of foods that are high in solid fats, added sugars, or sodium. Maintain a healthy weight Body mass index (BMI) is used to identify weight problems. It estimates body fat based on height and weight. Your health care provider can help determine your BMI and help you achieve or maintain a healthy weight. Get regular exercise Get regular exercise. This is one of the most important things you can do for your health. Most adults should:  Exercise for at least 150 minutes each week. The exercise should increase your heart rate and make you sweat (moderate-intensity exercise).  Do strengthening exercises at least twice a week. This is in addition to the moderate-intensity exercise.  Spend less time sitting. Even light physical activity can be beneficial. Watch cholesterol and blood lipids Have your blood tested for lipids and cholesterol at 45 years of age, then have this test every 5 years. Have your cholesterol levels checked more often if:  Your lipid or cholesterol levels are high.  You are older than 45 years of age.  You are at high risk for heart disease. What should I know about cancer screening? Depending on your health history and family history, you may need to have cancer screening at various ages. This may include screening for:  Breast cancer.  Cervical cancer.  Colorectal cancer.  Skin cancer.  Lung cancer. What should I know about heart disease, diabetes, and high blood  pressure? Blood pressure and heart disease  High blood pressure causes heart disease and increases the risk of stroke. This is more likely to develop in people who have high blood pressure readings, are of African descent, or are overweight.  Have your blood pressure checked: ? Every 3-5 years if you are 18-39 years of age. ? Every year if you are 40 years old or older. Diabetes Have regular diabetes screenings. This checks your fasting blood sugar level. Have the screening done:  Once every three years after age 40 if you are at a normal weight and have a low risk for diabetes.  More often and at a younger age if you are overweight or have a high risk for diabetes. What should I know about preventing infection? Hepatitis B If you have a higher risk for hepatitis B, you should be screened for this virus. Talk with your health care provider to find out if you are at risk for hepatitis B infection. Hepatitis C Testing is recommended for:  Everyone born from 1945 through 1965.  Anyone with known risk factors for hepatitis C. Sexually transmitted infections (STIs)  Get screened for STIs, including gonorrhea and chlamydia, if: ? You are sexually active and are younger than 45 years of age. ? You are older than 45 years of age and your health care provider tells you that you are at risk for this type of infection. ? Your sexual activity has changed since you were last screened, and you are at increased risk for chlamydia or gonorrhea. Ask your health care provider if   you are at risk.  Ask your health care provider about whether you are at high risk for HIV. Your health care provider may recommend a prescription medicine to help prevent HIV infection. If you choose to take medicine to prevent HIV, you should first get tested for HIV. You should then be tested every 3 months for as long as you are taking the medicine. Pregnancy  If you are about to stop having your period (premenopausal) and  you may become pregnant, seek counseling before you get pregnant.  Take 400 to 800 micrograms (mcg) of folic acid every day if you become pregnant.  Ask for birth control (contraception) if you want to prevent pregnancy. Osteoporosis and menopause Osteoporosis is a disease in which the bones lose minerals and strength with aging. This can result in bone fractures. If you are 65 years old or older, or if you are at risk for osteoporosis and fractures, ask your health care provider if you should:  Be screened for bone loss.  Take a calcium or vitamin D supplement to lower your risk of fractures.  Be given hormone replacement therapy (HRT) to treat symptoms of menopause. Follow these instructions at home: Lifestyle  Do not use any products that contain nicotine or tobacco, such as cigarettes, e-cigarettes, and chewing tobacco. If you need help quitting, ask your health care provider.  Do not use street drugs.  Do not share needles.  Ask your health care provider for help if you need support or information about quitting drugs. Alcohol use  Do not drink alcohol if: ? Your health care provider tells you not to drink. ? You are pregnant, may be pregnant, or are planning to become pregnant.  If you drink alcohol: ? Limit how much you use to 0-1 drink a day. ? Limit intake if you are breastfeeding.  Be aware of how much alcohol is in your drink. In the [place] one drink equals one 12 oz bottle of beer (355 mL), one 5 oz glass of wine (148 mL), or one 1 oz glass of hard liquor (44 mL). General instructions  Schedule regular health, dental, and eye exams.  Stay current with your vaccines.  Tell your health care provider if: ? You often feel depressed. ? You have ever been abused or do not feel safe at home. Summary  Adopting a healthy lifestyle and getting preventive care are important in promoting health and wellness.  Follow your health care provider's instructions about healthy  diet, exercising, and getting tested or screened for diseases.  Follow your health care provider's instructions on monitoring your cholesterol and blood pressure. This information is not intended to replace advice given to you by your health care provider. Make sure you discuss any questions you have with your health care provider. Document Revised: 10/19/2018 Document Reviewed: 10/19/2018 Elsevier Patient Education  2020 [employer]  

## 2020-05-27 NOTE — Progress Notes (Signed)
Established patient visit   Patient: Amanda Ramsey   DOB: [birth year]   44 y.o. Female  MRN: [id number] Visit Date: 05/27/2020  Today's healthcare provider: Margaretann Loveless, PA-C   Chief Complaint  Patient presents with  . Biometric Form   Subjective    HPI  Patient here for Biometric form to be fill out. Last physical was 05/29/2019.  Patient also need a refill on the Saxenda.  She reports that she is eating healthy. She is eating more fruit and cucumber.She reports that soda is something she has trouble with. Reports that since she is at home one of the big things she is doing is snacking. She is trying to walk more.  Wt Readings from Last 3 Encounters:  05/27/20 234 lb 12.8 oz (106.5 kg)  07/14/19 229 lb (103.9 kg)  06/07/19 227 lb (103 kg)   She also need a refill on the Vitamin D.  Patient Active Problem List   Diagnosis Date Noted  . Class 2 severe obesity due to excess calories with serious comorbidity and body mass index (BMI) of 37.0 to 37.9 in adult (HCC) 04/08/2016  . Dizziness and giddiness 04/25/2015  . Accumulation of fluid in tissues 04/25/2015  . Gastritis, Helicobacter pylori 04/25/2015  . Decreased potassium in the blood 04/25/2015  . Borderline diabetes 04/25/2015  . Avitaminosis D 04/25/2015  . Abnormal ECG 06/01/2014  . Chest pain 06/01/2014  . Irregular bleeding 03/18/2009  . Adiposity 09/28/2003  . Essential (primary) hypertension 11/17/2001  . Hypercholesterolemia without hypertriglyceridemia 11/29/2000  . Family history of cardiovascular disease 11/09/1998   Past Medical History:  Diagnosis Date  . Anemia   . Gallstones   . Hypertension        Medications: Outpatient Medications Prior to Visit  Medication Sig  . Insulin Pen Needle (BD PEN NEEDLE NANO 2ND GEN) 32G X 4 MM MISC To use with saxenda injections  . telmisartan (MICARDIS) 80 MG tablet Take 1 tablet (80 mg total) by mouth daily.  Marland Kitchen triamterene-hydrochlorothiazide  (MAXZIDE) 75-50 MG tablet TAKE 1 TABLET BY MOUTH EVERY DAY  . [DISCONTINUED] Liraglutide -Weight Management (SAXENDA) 18 MG/3ML SOPN Inject 0.6 mg into the skin daily. Increase by 0.6mg  per week as tolerated until max dose of 3mg  daily achieved.  . [DISCONTINUED] Vitamin D, Ergocalciferol, (DRISDOL) 1.25 MG (50000 UT) CAPS capsule TAKE ONE CAPSULE BY MOUTH EVERY 7 DAYS  . [DISCONTINUED] predniSONE (DELTASONE) 20 MG tablet Take 1 tablet (20 mg total) by mouth daily with breakfast.   No facility-administered medications prior to visit.    Review of Systems  Constitutional: Negative.   HENT: Negative.   Eyes: Negative.   Respiratory: Negative.   Cardiovascular: Negative.   Gastrointestinal: Negative.   Endocrine: Negative.   Genitourinary: Negative.   Musculoskeletal: Negative.   Skin: Negative.   Allergic/Immunologic: Negative.   Neurological: Negative.   Hematological: Negative.   Psychiatric/Behavioral: Negative.     Last CBC Lab Results  Component Value Date   WBC 5.6 05/28/2020   HGB 12.4 05/28/2020   HCT 38.4 05/28/2020   MCV 78 (L) 05/28/2020   MCH 25.1 (L) 05/28/2020   RDW 14.1 05/28/2020   PLT 357 05/28/2020   Last metabolic panel Lab Results  Component Value Date   GLUCOSE 82 05/28/2020   NA 142 05/28/2020   K 4.1 05/28/2020   CL 101 05/28/2020   CO2 25 05/28/2020   BUN 15 05/28/2020   CREATININE 0.92 05/28/2020   GFRNONAA  76 05/28/2020   GFRAA 88 05/28/2020   CALCIUM 9.5 05/28/2020   PROT 8.1 05/28/2020   ALBUMIN 4.5 05/28/2020   LABGLOB 3.6 05/28/2020   AGRATIO 1.3 05/28/2020   BILITOT 0.6 05/28/2020   ALKPHOS 34 (L) 05/28/2020   AST 14 05/28/2020   ALT 17 05/28/2020      Objective    BP 121/89 (BP Location: Left Arm, Patient Position: Sitting, Cuff Size: Large)   Pulse 81   Temp 98.1 F (36.7 C) (Oral)   Resp 16   Ht 5\' 7"  (1.702 m)   Wt 234 lb 12.8 oz (106.5 kg)   BMI 36.77 kg/m  BP Readings from Last 3 Encounters:  05/27/20 121/89    07/14/19 124/80  06/07/19 (!) 138/96   Wt Readings from Last 3 Encounters:  05/27/20 234 lb 12.8 oz (106.5 kg)  07/14/19 229 lb (103.9 kg)  06/07/19 227 lb (103 kg)      Physical Exam Vitals reviewed.  Constitutional:      General: She is not in acute distress.    Appearance: Normal appearance. She is well-developed. She is obese. She is not ill-appearing or diaphoretic.  Neck:     Thyroid: No thyromegaly.     Vascular: No JVD.     Trachea: No tracheal deviation.  Cardiovascular:     Rate and Rhythm: Normal rate and regular rhythm.     Pulses: Normal pulses.     Heart sounds: Normal heart sounds. No murmur heard.  No friction rub. No gallop.   Pulmonary:     Effort: Pulmonary effort is normal. No respiratory distress.     Breath sounds: Normal breath sounds. No wheezing or rales.  Musculoskeletal:     Cervical back: Normal range of motion and neck supple.     Right lower leg: No edema.     Left lower leg: No edema.  Lymphadenopathy:     Cervical: No cervical adenopathy.  Neurological:     Mental Status: She is alert.       Results for orders placed or performed in visit on 05/27/20  Lipid panel  Result Value Ref Range   Cholesterol, Total 220 (H) 100 - 199 mg/dL   Triglycerides 54 0 - 149 mg/dL   HDL 59 05/29/20 mg/dL   VLDL Cholesterol Cal 9 5 - 40 mg/dL   LDL Chol Calc (NIH) >97 (H) 0 - 99 mg/dL   Chol/HDL Ratio 3.7 0.0 - 4.4 ratio  Hemoglobin A1c  Result Value Ref Range   Hgb A1c MFr Bld 5.6 4.8 - 5.6 %   Est. average glucose Bld gHb Est-mCnc 114 mg/dL  CBC with Differential/Platelet  Result Value Ref Range   WBC 5.6 3.4 - 10.8 x10E3/uL   RBC 4.95 3.77 - 5.28 x10E6/uL   Hemoglobin 12.4 11.1 - 15.9 g/dL   Hematocrit 989 21.1 - 46.6 %   MCV 78 (L) 79 - 97 fL   MCH 25.1 (L) 26.6 - 33.0 pg   MCHC 32.3 31 - 35 g/dL   RDW 94.1 74.0 - 81.4 %   Platelets 357 150 - 450 x10E3/uL   Neutrophils 43 Not Estab. %   Lymphs 46 Not Estab. %   Monocytes 7 Not Estab. %    Eos 3 Not Estab. %   Basos 1 Not Estab. %   Neutrophils Absolute 2.4 1 - 7 x10E3/uL   Lymphocytes Absolute 2.6 0 - 3 x10E3/uL   Monocytes Absolute 0.4 0 - 0 x10E3/uL  EOS (ABSOLUTE) 0.1 0.0 - 0.4 x10E3/uL   Basophils Absolute 0.0 0 - 0 x10E3/uL   Immature Granulocytes 0 Not Estab. %   Immature Grans (Abs) 0.0 0.0 - 0.1 x10E3/uL  Comprehensive metabolic panel  Result Value Ref Range   Glucose 82 65 - 99 mg/dL   BUN 15 6 - 24 mg/dL   Creatinine, Ser 0.16 0.57 - 1.00 mg/dL   GFR calc non Af Amer 76 >59 mL/min/1.73   GFR calc Af Amer 88 >59 mL/min/1.73   BUN/Creatinine Ratio 16 9 - 23   Sodium 142 134 - 144 mmol/L   Potassium 4.1 3.5 - 5.2 mmol/L   Chloride 101 96 - 106 mmol/L   CO2 25 20 - 29 mmol/L   Calcium 9.5 8.7 - 10.2 mg/dL   Total Protein 8.1 6.0 - 8.5 g/dL   Albumin 4.5 3.8 - 4.8 g/dL   Globulin, Total 3.6 1.5 - 4.5 g/dL   Albumin/Globulin Ratio 1.3 1.2 - 2.2   Bilirubin Total 0.6 0.0 - 1.2 mg/dL   Alkaline Phosphatase 34 (L) 48 - 121 IU/L   AST 14 0 - 40 IU/L   ALT 17 0 - 32 IU/L  SARS-CoV-2 Semi-Quantitative Total Antibody, Spike  Result Value Ref Range   SARS-CoV-2 Semi-Quant Total Ab WILL FOLLOW    SARS-CoV-2 Spike Ab Interp WILL FOLLOW   Vitamin D (25 hydroxy)  Result Value Ref Range   Vit D, 25-Hydroxy 21.5 (L) 30.0 - 100.0 ng/mL    Assessment & Plan     1. Avitaminosis D Stable. Diagnosis pulled for medication refill. Continue current medical treatment plan. Will check labs as below and f/u pending results. - Vitamin D, Ergocalciferol, (DRISDOL) 1.25 MG (50000 UNIT) CAPS capsule; TAKE ONE CAPSULE BY MOUTH EVERY 7 DAYS  Dispense: 12 capsule; Refill: 1 - Vitamin D (25 hydroxy)  2. Encounter for weight loss counseling Intolerant to phentermine, has side effects. Has been on Saxenda and has not had complete success. Uses reduced porion sizes, healthier options, tries to count calories. Exercise 3-4 times per week. Will see if we can get [facility] covered. Copay  card provided to patient.  - Semaglutide-Weight Management (WEGOVY) 1 MG/0.5ML SOAJ; Inject 1 mg into the skin once a week.  Dispense: 2 mL; Refill: 0  3. Class 2 severe obesity due to excess calories with serious comorbidity and body mass index (BMI) of 36.0 to 36.9 in adult [facility] Counseled patient on healthy lifestyle modifications including dieting and exercise. See above medical treatment plan. - Semaglutide-Weight Management (WEGOVY) 1 MG/0.5ML SOAJ; Inject 1 mg into the skin once a week.  Dispense: 2 mL; Refill: 0  4. Encounter for biometric screening Will check labs as below and f/u pending results. - Lipid panel - Hemoglobin A1c - CBC with Differential/Platelet - Comprehensive metabolic panel - SARS-CoV-2 Semi-Quantitative Total Antibody, Spike   Return if symptoms worsen or fail to improve.      Delmer Islam, PA-C, have reviewed all documentation for this visit. The documentation on 05/29/20 for the exam, diagnosis, procedures, and orders are all accurate and complete.   Reine Just  [facility] [phone] (phone) [phone] (fax)  [facility]

## 2020-05-29 LAB — COMPREHENSIVE METABOLIC PANEL
ALT: 17 IU/L (ref 0–32)
AST: 14 IU/L (ref 0–40)
Albumin/Globulin Ratio: 1.3 (ref 1.2–2.2)
Albumin: 4.5 g/dL (ref 3.8–4.8)
Alkaline Phosphatase: 34 IU/L — ABNORMAL LOW (ref 48–121)
BUN/Creatinine Ratio: 16 (ref 9–23)
BUN: 15 mg/dL (ref 6–24)
Bilirubin Total: 0.6 mg/dL (ref 0.0–1.2)
CO2: 25 mmol/L (ref 20–29)
Calcium: 9.5 mg/dL (ref 8.7–10.2)
Chloride: 101 mmol/L (ref 96–106)
Creatinine, Ser: 0.92 mg/dL (ref 0.57–1.00)
GFR calc Af Amer: 88 mL/min/{1.73_m2} (ref >59)
GFR calc non Af Amer: 76 mL/min/{1.73_m2} (ref >59)
Globulin, Total: 3.6 g/dL (ref 1.5–4.5)
Glucose: 82 mg/dL (ref 65–99)
Potassium: 4.1 mmol/L (ref 3.5–5.2)
Sodium: 142 mmol/L (ref 134–144)
Total Protein: 8.1 g/dL (ref 6.0–8.5)

## 2020-05-29 LAB — SARS-COV-2 SEMI-QUANTITATIVE TOTAL ANTIBODY, SPIKE
SARS-CoV-2 Semi-Quant Total Ab: 1353 U/mL (ref <0.8)
SARS-CoV-2 Spike Ab Interp: POSITIVE

## 2020-05-29 LAB — CBC WITH DIFFERENTIAL/PLATELET
Basophils Absolute: 0 10*3/uL (ref 0.0–0.2)
Basos: 1 %
EOS (ABSOLUTE): 0.1 10*3/uL (ref 0.0–0.4)
Eos: 3 %
Hematocrit: 38.4 % (ref 34.0–46.6)
Hemoglobin: 12.4 g/dL (ref 11.1–15.9)
Immature Grans (Abs): 0 10*3/uL (ref 0.0–0.1)
Immature Granulocytes: 0 %
Lymphocytes Absolute: 2.6 10*3/uL (ref 0.7–3.1)
Lymphs: 46 %
MCH: 25.1 pg — ABNORMAL LOW (ref 26.6–33.0)
MCHC: 32.3 g/dL (ref 31.5–35.7)
MCV: 78 fL — ABNORMAL LOW (ref 79–97)
Monocytes Absolute: 0.4 10*3/uL (ref 0.1–0.9)
Monocytes: 7 %
Neutrophils Absolute: 2.4 10*3/uL (ref 1.4–7.0)
Neutrophils: 43 %
Platelets: 357 10*3/uL (ref 150–450)
RBC: 4.95 x10E6/uL (ref 3.77–5.28)
RDW: 14.1 % (ref 11.7–15.4)
WBC: 5.6 10*3/uL (ref 3.4–10.8)

## 2020-05-29 LAB — LIPID PANEL
Chol/HDL Ratio: 3.7 ratio (ref 0.0–4.4)
Cholesterol, Total: 220 mg/dL — ABNORMAL HIGH (ref 100–199)
HDL: 59 mg/dL (ref >39)
LDL Chol Calc (NIH): 152 mg/dL — ABNORMAL HIGH (ref 0–99)
Triglycerides: 54 mg/dL (ref 0–149)
VLDL Cholesterol Cal: 9 mg/dL (ref 5–40)

## 2020-05-29 LAB — HEMOGLOBIN A1C
Est. average glucose Bld gHb Est-mCnc: 114 mg/dL
Hgb A1c MFr Bld: 5.6 % (ref 4.8–5.6)

## 2020-05-29 LAB — VITAMIN D 25 HYDROXY (VIT D DEFICIENCY, FRACTURES): Vit D, 25-Hydroxy: 21.5 ng/mL — ABNORMAL LOW (ref 30.0–100.0)

## 2020-05-31 ENCOUNTER — Telehealth: Payer: Self-pay

## 2020-05-31 NOTE — Telephone Encounter (Signed)
-----   Message from Margaretann Loveless, [place] sent at 05/30/2020  7:09 PM EDT ----- Cholesterol is up compared to last year. Currently your risk of having a cardiovascular event over the next 10 years remains low at 1.5%. No need for cholesterol lowering medications at this time. Continue healthy lifestyle modifications with dieting and exercise as you have been doing. A1c/sugar is normal. Blood count is normal. Kidney and liver function are normal. Sodium, potassium and calcium are normal. Antibodies to covid 19 present. Vit D has improved slightly but still low. Continue high dose Vit D supplement once weekly. Biometric form completed and faxed.

## 2020-05-31 NOTE — Telephone Encounter (Signed)
Pt advised.   Thanks,   -Safiyya Stokes  

## 2020-06-10 ENCOUNTER — Telehealth: Payer: Self-pay | Admitting: Physician Assistant

## 2020-06-10 ENCOUNTER — Other Ambulatory Visit: Payer: Self-pay | Admitting: Physician Assistant

## 2020-06-10 DIAGNOSIS — I10 Essential (primary) hypertension: Secondary | ICD-10-CM

## 2020-06-10 DIAGNOSIS — Z6836 Body mass index (BMI) 36.0-36.9, adult: Secondary | ICD-10-CM

## 2020-06-10 NOTE — Telephone Encounter (Signed)
Pt insurance plan denied PA for Semaglutide-Weight Management (WEGOVY) 1 MG/0.5ML SOAJ But her plan allows for resubmission within 6 month of denial / resubmit any additional info that was left out / please advise   Ref# [id number]

## 2020-06-12 NOTE — Telephone Encounter (Signed)
Josie I think you already resubmitted on Monday if I am not mistaken, but forwarding to you just in case.

## 2020-06-13 MED ORDER — DIETHYLPROPION HCL 25 MG PO TABS: 25.0000 mg | ORAL_TABLET | Freq: Three times a day (TID) | ORAL | 0 refills | Status: DC

## 2020-06-13 NOTE — Telephone Encounter (Signed)
Sent in for her

## 2020-06-13 NOTE — Addendum Note (Signed)
Addended by: Margaretann Loveless on: 06/13/2020 01:57 PM   Modules accepted: Orders

## 2020-06-13 NOTE — Telephone Encounter (Signed)
Yes, still pending. I saw the denial letter and it said: "Per your health plan's criteria, this drug is covered IF you meet the following: You have tried or cannot use at least two drugs from the following: Diethylpropion Benzphetamine Phendimetrazine

## 2020-06-13 NOTE — Telephone Encounter (Signed)
Can we let Amanda Ramsey know it was denied and they have some other medications they would like for her to try.   I have personally only prescribed the Diethylpropion before. It is a stimulant similar to phentermine. If she is willing to try this I can send in for her.

## 2020-06-13 NOTE — Telephone Encounter (Signed)
Patient advised as directed below. She is willing to try the Diethylpropion.

## 2020-08-12 ENCOUNTER — Other Ambulatory Visit: Payer: Self-pay

## 2020-08-12 ENCOUNTER — Ambulatory Visit
Admission: RE | Admit: 2020-08-12 | Discharge: 2020-08-12 | Disposition: A | Discharge status: Home / Self Care | Payer: Managed Care, Other (non HMO) | Attending: Physician Assistant | Admitting: Physician Assistant

## 2020-08-12 ENCOUNTER — Encounter: Payer: Self-pay | Admitting: Physician Assistant

## 2020-08-12 ENCOUNTER — Other Ambulatory Visit: Payer: Self-pay | Admitting: Physician Assistant

## 2020-08-12 ENCOUNTER — Ambulatory Visit
Admission: RE | Admit: 2020-08-12 | Discharge: 2020-08-12 | Disposition: A | Discharge status: Home / Self Care | Payer: Managed Care, Other (non HMO) | Source: Ambulatory Visit | Attending: Physician Assistant | Admitting: Physician Assistant

## 2020-08-12 ENCOUNTER — Ambulatory Visit (INDEPENDENT_AMBULATORY_CARE_PROVIDER_SITE_OTHER): Payer: Managed Care, Other (non HMO) | Admitting: Physician Assistant

## 2020-08-12 VITALS — BP 133/94 | HR 81 | Temp 98.6°F | Resp 16

## 2020-08-12 DIAGNOSIS — M25571 Pain in right ankle and joints of right foot: Secondary | ICD-10-CM | POA: Diagnosis not present

## 2020-08-12 DIAGNOSIS — Z1231 Encounter for screening mammogram for malignant neoplasm of breast: Secondary | ICD-10-CM

## 2020-08-12 MED ORDER — PREDNISONE 10 MG (21) PO TBPK: ORAL_TABLET | ORAL | 0 refills | Status: DC

## 2020-08-12 NOTE — Progress Notes (Signed)
Established patient visit   Patient: Amanda Ramsey   DOB: 1976-04-11   44 y.o. Female  MRN: [id number] Visit Date: 08/12/2020  Today's healthcare provider: Margaretann Loveless, PA-C   Chief Complaint  Patient presents with  . Foot Pain   Subjective    HPI  Patient coming in today with Right foot pain. Reports that she is not able to walk on it. No injury. This is new flare up started last week on Monday or Tuesday. Reports that she put on a brace to help her, but it didn't help. Has associated swelling, numbness and tingling. There is redness and warmth over the right ankle.   Patient Active Problem List   Diagnosis Date Noted  . Class 2 severe obesity due to excess calories with serious comorbidity and body mass index (BMI) of 37.0 to 37.9 in adult (HCC) 04/08/2016  . Dizziness and giddiness 04/25/2015  . Accumulation of fluid in tissues 04/25/2015  . Gastritis, Helicobacter pylori 04/25/2015  . Decreased potassium in the blood 04/25/2015  . Borderline diabetes 04/25/2015  . Avitaminosis D 04/25/2015  . Abnormal ECG 06/01/2014  . Chest pain 06/01/2014  . Irregular bleeding 03/18/2009  . Adiposity 09/28/2003  . Essential (primary) hypertension 11/17/2001  . Hypercholesterolemia without hypertriglyceridemia 11/29/2000  . Family history of cardiovascular disease 11/09/1998   Past Medical History:  Diagnosis Date  . Anemia   . Gallstones   . Hypertension        Medications: Outpatient Medications Prior to Visit  Medication Sig  . Diethylpropion HCl 25 MG TABS Take 1 tablet (25 mg total) by mouth with breakfast, with lunch, and with evening meal.  . telmisartan (MICARDIS) 80 MG tablet TAKE 1 TABLET(80 MG) BY MOUTH DAILY  . triamterene-hydrochlorothiazide (MAXZIDE) 75-50 MG tablet TAKE 1 TABLET BY MOUTH EVERY DAY  . Vitamin D, Ergocalciferol, (DRISDOL) 1.25 MG (50000 UNIT) CAPS capsule TAKE ONE CAPSULE BY MOUTH EVERY 7 DAYS  . Insulin Pen Needle (BD PEN NEEDLE  NANO 2ND GEN) 32G X 4 MM MISC To use with saxenda injections  . Semaglutide-Weight Management (WEGOVY) 1 MG/0.5ML SOAJ Inject 1 mg into the skin once a week.   No facility-administered medications prior to visit.    Review of Systems  Constitutional: Negative.   Respiratory: Negative.   Cardiovascular: Negative.   Musculoskeletal: Positive for arthralgias and joint swelling.  Skin: Positive for color change.  Neurological: Positive for numbness. Negative for weakness.    Last CBC Lab Results  Component Value Date   WBC 7.2 08/12/2020   HGB 11.9 08/12/2020   HCT 36.7 08/12/2020   MCV 80 08/12/2020   MCH 25.8 (L) 08/12/2020   RDW 13.0 08/12/2020   PLT 392 08/12/2020   Last metabolic panel Lab Results  Component Value Date   GLUCOSE 94 08/12/2020   NA 142 08/12/2020   K 3.9 08/12/2020   CL 101 08/12/2020   CO2 28 08/12/2020   BUN 10 08/12/2020   CREATININE 0.89 08/12/2020   GFRNONAA 79 08/12/2020   GFRAA 91 08/12/2020   CALCIUM 9.3 08/12/2020   PROT 8.1 05/28/2020   ALBUMIN 4.5 05/28/2020   LABGLOB 3.6 05/28/2020   AGRATIO 1.3 05/28/2020   BILITOT 0.6 05/28/2020   ALKPHOS 34 (L) 05/28/2020   AST 14 05/28/2020   ALT 17 05/28/2020      Objective    BP (!) 133/94 (BP Location: Left Arm, Patient Position: Sitting, Cuff Size: Large)   Pulse 81  Temp 98.6 F (37 C) (Oral)   Resp 16  BP Readings from Last 3 Encounters:  08/12/20 (!) 133/94  05/27/20 121/89  07/14/19 124/80   Wt Readings from Last 3 Encounters:  05/27/20 234 lb 12.8 oz (106.5 kg)  07/14/19 229 lb (103.9 kg)  06/07/19 227 lb (103 kg)      Physical Exam Vitals reviewed.  Constitutional:      General: She is not in acute distress.    Appearance: Normal appearance. She is well-developed. She is obese. She is not ill-appearing.  HENT:     Head: Normocephalic and atraumatic.  Pulmonary:     Effort: Pulmonary effort is normal. No respiratory distress.  Musculoskeletal:     Cervical back:  Normal range of motion and neck supple.     Right ankle: Swelling present. Tenderness present over the medial malleolus. Decreased range of motion. Anterior drawer test negative. Normal pulse.     Right Achilles Tendon: Normal. No tenderness or defects. Thompson's test negative.     Left ankle: Normal.     Left Achilles Tendon: Normal.     Right foot: Normal range of motion and normal capillary refill. Swelling present. No tenderness or bony tenderness. Normal pulse.     Left foot: Normal.  Neurological:     Mental Status: She is alert.  Psychiatric:        Behavior: Behavior normal.        Thought Content: Thought content normal.        Judgment: Judgment normal.      No results found for any visits on 08/12/20.  Assessment & Plan     1. Acute right ankle pain Suspect possible gout vs posterior tibial tendinitis. Will check labs and get imaging as below and f/u pending results. Prednisone will be given for acute inflammation.  - CBC w/Diff/Platelet - Uric acid - Basic Metabolic Panel (BMET) - DG Ankle Complete Right; Future - predniSONE (STERAPRED UNI-PAK 21 TAB) 10 MG (21) TBPK tablet; 6 day taper take as directed on package instructions  Dispense: 21 tablet; Refill: 0   No follow-ups on file.      Delmer Islam, PA-C, have reviewed all documentation for this visit. The documentation on 08/15/20 for the exam, diagnosis, procedures, and orders are all accurate and complete.   Reine Just  [facility] [phone] (phone) [phone] (fax)  [facility]

## 2020-08-13 ENCOUNTER — Telehealth: Payer: Self-pay

## 2020-08-13 LAB — CBC WITH DIFFERENTIAL/PLATELET
Basophils Absolute: 0 10*3/uL (ref 0.0–0.2)
Basos: 1 %
EOS (ABSOLUTE): 0.2 10*3/uL (ref 0.0–0.4)
Eos: 2 %
Hematocrit: 36.7 % (ref 34.0–46.6)
Hemoglobin: 11.9 g/dL (ref 11.1–15.9)
Immature Grans (Abs): 0 10*3/uL (ref 0.0–0.1)
Immature Granulocytes: 0 %
Lymphocytes Absolute: 2.6 10*3/uL (ref 0.7–3.1)
Lymphs: 37 %
MCH: 25.8 pg — ABNORMAL LOW (ref 26.6–33.0)
MCHC: 32.4 g/dL (ref 31.5–35.7)
MCV: 80 fL (ref 79–97)
Monocytes Absolute: 0.4 10*3/uL (ref 0.1–0.9)
Monocytes: 6 %
Neutrophils Absolute: 4 10*3/uL (ref 1.4–7.0)
Neutrophils: 54 %
Platelets: 392 10*3/uL (ref 150–450)
RBC: 4.61 x10E6/uL (ref 3.77–5.28)
RDW: 13 % (ref 11.7–15.4)
WBC: 7.2 10*3/uL (ref 3.4–10.8)

## 2020-08-13 LAB — BASIC METABOLIC PANEL
BUN/Creatinine Ratio: 11 (ref 9–23)
BUN: 10 mg/dL (ref 6–24)
CO2: 28 mmol/L (ref 20–29)
Calcium: 9.3 mg/dL (ref 8.7–10.2)
Chloride: 101 mmol/L (ref 96–106)
Creatinine, Ser: 0.89 mg/dL (ref 0.57–1.00)
GFR calc Af Amer: 91 mL/min/{1.73_m2} (ref >59)
GFR calc non Af Amer: 79 mL/min/{1.73_m2} (ref >59)
Glucose: 94 mg/dL (ref 65–99)
Potassium: 3.9 mmol/L (ref 3.5–5.2)
Sodium: 142 mmol/L (ref 134–144)

## 2020-08-13 LAB — URIC ACID: Uric Acid: 7.8 mg/dL — ABNORMAL HIGH (ref 2.6–6.2)

## 2020-08-13 NOTE — Telephone Encounter (Signed)
Prednisone was sent in at her visit. Prednisone can treat gout flares.

## 2020-08-13 NOTE — Telephone Encounter (Signed)
-----   Message from Margaretann Loveless, [place] sent at 08/13/2020  9:00 AM EDT ----- There is the soft tissue swelling over the ankle, but no bony abnormality. There is noted to have heel spurs also. These would not cause this issue. I do believe this is a gout flare.

## 2020-08-13 NOTE — Telephone Encounter (Signed)
Patient advised as directed below. 

## 2020-08-13 NOTE — Telephone Encounter (Signed)
-----   Message from Margaretann Loveless, [place] sent at 08/13/2020  8:53 AM EDT ----- Blood count is normal. Uric acid is elevated. It is possible this is a gout attack as I had discussed some yesterday. The prednisone will still calm down a gout attack. If another occurs we can discuss medications that can prevent gout. Kidney function is normal. Sodium, potassium, and calcium are normal.

## 2020-08-13 NOTE — Telephone Encounter (Signed)
Patient advised as directed below. Patient is asking if she is going to get something for this?

## 2020-08-14 NOTE — Telephone Encounter (Signed)
Patient was advised.  

## 2020-12-14 ENCOUNTER — Other Ambulatory Visit: Payer: Self-pay | Admitting: Physician Assistant

## 2020-12-14 DIAGNOSIS — E559 Vitamin D deficiency, unspecified: Secondary | ICD-10-CM

## 2020-12-14 NOTE — Telephone Encounter (Signed)
Requested medication (s) are due for refill today: yes  Requested medication (s) are on the active medication list: yes  Last refill:  12/13/20  Future visit scheduled: no  Notes to clinic:  not delegated    Requested Prescriptions  Pending Prescriptions Disp Refills   Vitamin D, Ergocalciferol, (DRISDOL) 1.25 MG (50000 UNIT) CAPS capsule [Pharmacy Med Name: VITAMIN D2 50,000IU (ERGO) CAP RX] 12 capsule 1    Sig: TAKE 1 CAPSULE BY MOUTH EVERY 7 DAYS      Endocrinology:  Vitamins - Vitamin D Supplementation Failed - 12/14/2020  8:09 AM      Failed - 50,000 IU strengths are not delegated      Failed - Phosphate in normal range and within 360 days    No results found for: PHOS        Failed - Vitamin D in normal range and within 360 days    Vit D, 25-Hydroxy  Date Value Ref Range Status  05/28/2020 21.5 (L) 30.0 - 100.0 ng/mL Final    Comment:    Vitamin D deficiency has been defined by the Institute of Medicine and an Endocrine Society practice guideline as a level of serum 25-OH vitamin D less than 20 ng/mL (1,2). The Endocrine Society went on to further define vitamin D insufficiency as a level between 21 and 29 ng/mL (2). 1. IOM (Institute of Medicine). 2010. Dietary reference    intakes for calcium and D. [place]: The    [employer]. 2. Holick MF, Binkley [place], Bischoff-Ferrari HA, et al.    Evaluation, treatment, and prevention of vitamin D    deficiency: an Endocrine Society clinical practice    guideline. JCEM. 2011 Jul; 96(7):1911-30.           Passed - Ca in normal range and within 360 days    Calcium  Date Value Ref Range Status  08/12/2020 9.3 8.7 - 10.2 mg/dL Final          Passed - Valid encounter within last 12 months    Recent Outpatient Visits           4 months ago Acute right ankle pain   [facility] [place], Alessandra Bevels, [place]   6 months ago Encounter for biometric screening   [facility] Joycelyn Man M, [place]   9 months ago Sore throat   [facility] Trey Sailors, [place]   1 year ago Essential hypertension   [facility] [place], Alessandra Bevels, [place]   1 year ago Annual physical exam   [facility] Margaretann Loveless, [place]

## 2020-12-23 ENCOUNTER — Other Ambulatory Visit: Payer: Self-pay

## 2020-12-23 ENCOUNTER — Ambulatory Visit
Admission: RE | Admit: 2020-12-23 | Discharge: 2020-12-23 | Disposition: A | Discharge status: Home / Self Care | Payer: Managed Care, Other (non HMO) | Source: Ambulatory Visit | Attending: Physician Assistant | Admitting: Physician Assistant

## 2020-12-23 DIAGNOSIS — Z1231 Encounter for screening mammogram for malignant neoplasm of breast: Secondary | ICD-10-CM | POA: Diagnosis present

## 2021-01-04 ENCOUNTER — Other Ambulatory Visit: Payer: Self-pay | Admitting: Physician Assistant

## 2021-01-04 DIAGNOSIS — I10 Essential (primary) hypertension: Secondary | ICD-10-CM

## 2021-01-04 NOTE — Telephone Encounter (Signed)
Requested Prescriptions  Pending Prescriptions Disp Refills  . telmisartan (MICARDIS) 80 MG tablet [Pharmacy Med Name: TELMISARTAN 80MG  TABLETS] 90 tablet 0    Sig: TAKE 1 TABLET(80 MG) BY MOUTH DAILY     Cardiovascular:  Angiotensin Receptor Blockers Failed - 01/04/2021 11:03 AM      Failed - Last BP in normal range    BP Readings from Last 1 Encounters:  08/12/20 (!) 133/94         Passed - Cr in normal range and within 180 days    Creatinine, Ser  Date Value Ref Range Status  08/12/2020 0.89 0.57 - 1.00 mg/dL Final         Passed - K in normal range and within 180 days    Potassium  Date Value Ref Range Status  08/12/2020 3.9 3.5 - 5.2 mmol/L Final         Passed - Patient is not pregnant      Passed - Valid encounter within last 6 months    Recent Outpatient Visits          4 months ago Acute right ankle pain   [facility] [place], [place], Alessandra Bevels   7 months ago Encounter for biometric screening   [place], [place], [place]   10 months ago Sore throat   [facility] [place], [place], [place]ella [place]   1 year ago Essential hypertension   [facility] [place], [place], Alessandra Bevels   1 year ago Annual physical exam   [facility] [place], [place], Alessandra Bevels      Future Appointments            In 1 week [place], Rosezetta Schlatter, PA-C Alessandra Bevels, [facility]

## 2021-01-16 ENCOUNTER — Ambulatory Visit: Payer: Managed Care, Other (non HMO) | Admitting: Physician Assistant

## 2021-01-16 ENCOUNTER — Encounter: Payer: Self-pay | Admitting: Physician Assistant

## 2021-01-16 ENCOUNTER — Other Ambulatory Visit: Payer: Self-pay

## 2021-01-16 VITALS — BP 124/77 | HR 74 | Temp 98.5°F | Ht 68.0 in | Wt 251.7 lb

## 2021-01-16 DIAGNOSIS — R7303 Prediabetes: Secondary | ICD-10-CM

## 2021-01-16 DIAGNOSIS — E559 Vitamin D deficiency, unspecified: Secondary | ICD-10-CM | POA: Diagnosis not present

## 2021-01-16 DIAGNOSIS — Z6838 Body mass index (BMI) 38.0-38.9, adult: Secondary | ICD-10-CM | POA: Diagnosis not present

## 2021-01-16 DIAGNOSIS — Z124 Encounter for screening for malignant neoplasm of cervix: Secondary | ICD-10-CM | POA: Diagnosis not present

## 2021-01-16 DIAGNOSIS — I10 Essential (primary) hypertension: Secondary | ICD-10-CM

## 2021-01-16 DIAGNOSIS — Z1211 Encounter for screening for malignant neoplasm of colon: Secondary | ICD-10-CM

## 2021-01-16 DIAGNOSIS — R87810 Cervical high risk human papillomavirus (HPV) DNA test positive: Secondary | ICD-10-CM

## 2021-01-16 DIAGNOSIS — Z0000 Encounter for general adult medical examination without abnormal findings: Secondary | ICD-10-CM | POA: Diagnosis not present

## 2021-01-16 MED ORDER — VITAMIN D (ERGOCALCIFEROL) 1.25 MG (50000 UNIT) PO CAPS: ORAL_CAPSULE | ORAL | 1 refills | Status: DC

## 2021-01-16 MED ORDER — OZEMPIC (0.25 OR 0.5 MG/DOSE) 2 MG/1.5ML ~~LOC~~ SOPN: 0.5000 mg | PEN_INJECTOR | SUBCUTANEOUS | 3 refills | Status: DC

## 2021-01-16 MED ORDER — TRIAMTERENE-HCTZ 75-50 MG PO TABS
1.0000 | ORAL_TABLET | Freq: Every day | ORAL | 1 refills | Status: DC
Start: 2021-01-16 — End: 2023-03-31

## 2021-01-16 MED ORDER — TELMISARTAN 80 MG PO TABS: ORAL_TABLET | ORAL | 1 refills | Status: DC

## 2021-01-16 NOTE — Progress Notes (Signed)
Complete physical exam   Patient: Amanda Ramsey   DOB: Nov 10, 1975   44 y.o. Female  MRN: [id number] Visit Date: 01/16/2021  Today's healthcare provider: Margaretann Loveless, PA-C   No chief complaint on file.  Subjective    Amanda Ramsey is a 45 y.o. female who presents today for a complete physical exam.  She reports consuming a general diet. The patient does not participate in regular exercise at present. She generally feels well. She reports sleeping well. She does have additional problems to discuss today.  HPI  Pt c/o of irritating abdominal pain on right side. Pt says frequency of it is off and on x few weeks. Now improved and not bothering her.  Past Medical History:  Diagnosis Date  . Anemia   . Gallstones   . Hypertension    Past Surgical History:  Procedure Laterality Date  . ABDOMINAL HYSTERECTOMY  2011  . CHOLECYSTECTOMY     Social History   Socioeconomic History  . Marital status: Single    Spouse name: Not on file  . Number of children: Not on file  . Years of education: Not on file  . Highest education level: Not on file  Occupational History  . Not on file  Tobacco Use  . Smoking status: Never Smoker  . Smokeless tobacco: Never Used  Vaping Use  . Vaping Use: Never used  Substance and Sexual Activity  . Alcohol use: Yes    Alcohol/week: 0.0 standard drinks    Comment: occasionally  . Drug use: No  . Sexual activity: Not Currently    Birth control/protection: Surgical  Other Topics Concern  . Not on file  Social History Narrative  . Not on file   Social Determinants of Health   Financial Resource Strain: Not on file  Food Insecurity: Not on file  Transportation Needs: Not on file  Physical Activity: Not on file  Stress: Not on file  Social Connections: Not on file  Intimate Partner Violence: Not on file   Family Status  Relation Name Status  . Mother  Alive  . Father  Deceased  . Sister  Alive  . Neg Hx  (Not  Specified)   Family History  Problem Relation Age of Onset  . Hypertension Mother   . Alcohol abuse Father   . Cirrhosis Father   . Seizures Father   . Hypertension Sister   . Breast cancer Neg Hx    No Known Allergies  Patient Care Team: Reine Just as PCP - General (Physician Assistant)   Medications: Outpatient Medications Prior to Visit  Medication Sig  . Vitamin D, Ergocalciferol, (DRISDOL) 1.25 MG (50000 UNIT) CAPS capsule TAKE 1 CAPSULE BY MOUTH EVERY 7 DAYS  . Diethylpropion HCl 25 MG TABS Take 1 tablet (25 mg total) by mouth with breakfast, with lunch, and with evening meal.  . predniSONE (STERAPRED UNI-PAK 21 TAB) 10 MG (21) TBPK tablet 6 day taper take as directed on package instructions  . telmisartan (MICARDIS) 80 MG tablet TAKE 1 TABLET(80 MG) BY MOUTH DAILY  . triamterene-hydrochlorothiazide (MAXZIDE) 75-50 MG tablet TAKE 1 TABLET BY MOUTH EVERY DAY   No facility-administered medications prior to visit.    Review of Systems  Constitutional: Negative.   HENT: Negative.   Eyes: Negative.   Respiratory: Negative.   Cardiovascular: Negative.   Gastrointestinal: Positive for abdominal pain. Negative for diarrhea, nausea and vomiting.  Endocrine: Negative.   Genitourinary: Negative.  Musculoskeletal: Negative.   Skin: Negative.   Allergic/Immunologic: Negative.   Neurological: Positive for light-headedness.  Hematological: Negative.   Psychiatric/Behavioral: Negative.     Last CBC Lab Results  Component Value Date   WBC 6.0 01/16/2021   HGB 12.8 01/16/2021   HCT 39.4 01/16/2021   MCV 79 01/16/2021   MCH 25.6 (L) 01/16/2021   RDW 13.2 01/16/2021   PLT 392 01/16/2021   Last metabolic panel Lab Results  Component Value Date   GLUCOSE 101 (H) 01/16/2021   NA 141 01/16/2021   K 4.0 01/16/2021   CL 102 01/16/2021   CO2 20 01/16/2021   BUN 12 01/16/2021   CREATININE 1.04 (H) 01/16/2021   GFRNONAA 79 08/12/2020   GFRAA 91 08/12/2020    CALCIUM 9.8 01/16/2021   PROT 8.6 (H) 01/16/2021   ALBUMIN 4.4 01/16/2021   LABGLOB 4.2 01/16/2021   AGRATIO 1.0 (L) 01/16/2021   BILITOT 0.4 01/16/2021   ALKPHOS 37 (L) 01/16/2021   AST 13 01/16/2021   ALT 24 01/16/2021      Objective    There were no vitals taken for this visit. BP Readings from Last 3 Encounters:  01/16/21 124/77  08/12/20 (!) 133/94  05/27/20 121/89   Wt Readings from Last 3 Encounters:  01/16/21 251 lb 11.2 oz (114.2 kg)  05/27/20 234 lb 12.8 oz (106.5 kg)  07/14/19 229 lb (103.9 kg)      Physical Exam Vitals reviewed. Exam conducted with a chaperone present.  Constitutional:      General: She is not in acute distress.    Appearance: Normal appearance. She is well-developed. She is obese. She is not ill-appearing or diaphoretic.  HENT:     Head: Normocephalic and atraumatic.     Right Ear: Tympanic membrane, ear canal and external ear normal.     Left Ear: Tympanic membrane, ear canal and external ear normal.  Eyes:     General: No scleral icterus.       Right eye: No discharge.        Left eye: No discharge.     Extraocular Movements: Extraocular movements intact.     Conjunctiva/sclera: Conjunctivae normal.     Pupils: Pupils are equal, round, and reactive to light.  Neck:     Thyroid: No thyromegaly.     Vascular: No JVD.     Trachea: No tracheal deviation.  Cardiovascular:     Rate and Rhythm: Normal rate and regular rhythm.     Pulses: Normal pulses.     Heart sounds: Normal heart sounds. No murmur heard. No friction rub. No gallop.   Pulmonary:     Effort: Pulmonary effort is normal. No respiratory distress.     Breath sounds: Normal breath sounds. No wheezing or rales.  Chest:     Chest wall: No tenderness.  Abdominal:     General: Abdomen is flat. Bowel sounds are normal. There is no distension.     Palpations: Abdomen is soft. There is no mass.     Tenderness: There is no abdominal tenderness. There is no guarding or  rebound.     Hernia: There is no hernia in the left inguinal area or right inguinal area.  Genitourinary:    General: Normal vulva.     Pubic Area: No rash.      Labia:        Right: No rash, tenderness, lesion or injury.        Left: No rash, tenderness, lesion or  injury.      Urethra: No prolapse, urethral pain, urethral swelling or urethral lesion.     Vagina: Normal. No vaginal discharge, erythema or lesions.     Cervix: No cervical motion tenderness, discharge, friability, lesion or erythema.     Uterus: Absent.      Adnexa: Right adnexa normal and left adnexa normal.     Rectum: Normal.  Musculoskeletal:        General: No tenderness. Normal range of motion.     Cervical back: Normal range of motion and neck supple. No tenderness.     Right lower leg: No edema.     Left lower leg: No edema.  Lymphadenopathy:     Cervical: No cervical adenopathy.     Lower Body: No right inguinal adenopathy. No left inguinal adenopathy.  Skin:    General: Skin is warm and dry.     Capillary Refill: Capillary refill takes less than 2 seconds.     Findings: No rash.  Neurological:     General: No focal deficit present.     Mental Status: She is alert and oriented to person, place, and time. Mental status is at baseline.     Motor: No weakness.     Gait: Gait normal.  Psychiatric:        Mood and Affect: Mood normal.        Behavior: Behavior normal.        Thought Content: Thought content normal.        Judgment: Judgment normal.     Last depression screening scores PHQ 2/9 Scores 08/12/2020 06/07/2019 05/20/2017  PHQ - 2 Score 1 0 1  PHQ- 9 Score - - 5   Last fall risk screening Fall Risk  02/20/2020  Falls in the past year? 0  Number falls in past yr: 0  Injury with Fall? 0   Last Audit-C alcohol use screening No flowsheet data found. A score of 3 or more in women, and 4 or more in men indicates increased risk for alcohol abuse, EXCEPT if all of the points are from question 1    No results found for any visits on 01/16/21.  Assessment & Plan    Routine Health Maintenance and Physical Exam  Exercise Activities and Dietary recommendations Goals   None     Immunization History  Administered Date(s) Administered  . Moderna Sars-Covid-2 Vaccination 03/02/2020, 03/30/2020  . Td 03/18/2005  . Tdap 09/24/2011    Health Maintenance  Topic Date Due  . INFLUENZA VACCINE  Never done  . COLONOSCOPY (Pts 45-777yrs Insurance coverage will need to be confirmed)  Never done  . COVID-19 Vaccine (3 - Booster for Moderna series) 09/30/2020  . TETANUS/TDAP  09/23/2021  . PAP SMEAR-Modifier  06/06/2024  . Hepatitis C Screening  Completed  . HIV Screening  Completed  . HPV VACCINES  Aged Out    Discussed health benefits of physical activity, and encouraged her to engage in regular exercise appropriate for her age and condition.  1. Annual physical exam Normal physical exam today. Will check labs as below and f/u pending lab results. If labs are stable and WNL she will not need to have these rechecked for one year at her next annual physical exam. She is to call the office in the meantime if she has any acute issue, questions or concerns.  2. Colon cancer screening Due for colon cancer screening. Referral placed to GI.  - Ambulatory referral to Gastroenterology  3. Prediabetes  Patient does have prediabetes and is obese. Would benefit from weight loss and sugar control to prevent progressing. Patient is intolerant to phentermine. Bernie Covey was not covered by insurance. Will see if we can get Ozempic approved.  - Semaglutide,0.25 or 0.5MG /DOS, (OZEMPIC, 0.25 OR 0.5 MG/DOSE,) 2 MG/1.5ML SOPN; Inject 0.5 mg into the skin once a week.  Dispense: 1.5 mL; Refill: 3 - CBC w/Diff/Platelet - Comprehensive Metabolic Panel (CMET) - HgB A1c - Lipid Panel With LDL/HDL Ratio  4. Class 2 severe obesity due to excess calories with serious comorbidity and body mass index (BMI) of 38.0  to 38.9 in adult [facility]) Counseled patient on healthy lifestyle modifications including dieting and exercise.  Will check labs as below and f/u pending results. See above medical treatment plan. - Semaglutide,0.25 or 0.5MG /DOS, (OZEMPIC, 0.25 OR 0.5 MG/DOSE,) 2 MG/1.5ML SOPN; Inject 0.5 mg into the skin once a week.  Dispense: 1.5 mL; Refill: 3 - CBC w/Diff/Platelet - Comprehensive Metabolic Panel (CMET) - TSH - HgB A1c - Lipid Panel With LDL/HDL Ratio  5. Cervical cancer screening Pap collected today. Will send as below and f/u pending results. - IGP,Aptima HPV,CtNg Age Gdln  6. Avitaminosis D Stable. Diagnosis pulled for medication refill. Continue current medical treatment plan. Will check labs as below and f/u pending results. - CBC w/Diff/Platelet - Vitamin D (25 hydroxy) - Vitamin D, Ergocalciferol, (DRISDOL) 1.25 MG (50000 UNIT) CAPS capsule; TAKE 1 CAPSULE BY MOUTH EVERY 7 DAYS  Dispense: 12 capsule; Refill: 1  7. Essential (primary) hypertension Stable. Diagnosis pulled for medication refill. Continue current medical treatment plan. Will check labs as below and f/u pending results. - CBC w/Diff/Platelet - Comprehensive Metabolic Panel (CMET) - TSH - HgB A1c - Lipid Panel With LDL/HDL Ratio - triamterene-hydrochlorothiazide (MAXZIDE) 75-50 MG tablet; Take 1 tablet by mouth daily.  Dispense: 90 tablet; Refill: 1 - telmisartan (MICARDIS) 80 MG tablet; TAKE 1 TABLET(80 MG) BY MOUTH DAILY  Dispense: 90 tablet; Refill: 1  8. Pap smear of cervix shows high risk HPV present Pap collected today. Will send as below and f/u pending results. - IGP,Aptima HPV,CtNg Age Gdln   No follow-ups on file.     Delmer Islam, PA-C, have reviewed all documentation for this visit. The documentation on 01/26/21 for the exam, diagnosis, procedures, and orders are all accurate and complete.   Reine Just  [facility] [phone] (phone) [phone]  (fax)  [facility]

## 2021-01-16 NOTE — Patient Instructions (Signed)
Semaglutide injection solution What is this medicine? SEMAGLUTIDE (Sem a GLOO tide) is used to improve blood sugar control in adults with type 2 diabetes. This medicine may be used with other diabetes medicines. This drug may also reduce the risk of heart attack or stroke if you have type 2 diabetes and risk factors for heart disease. This medicine may be used for other purposes; ask your health care provider or pharmacist if you have questions. COMMON BRAND NAME(S): OZEMPIC What should I tell my health care provider before I take this medicine? They need to know if you have any of these conditions: endocrine tumors (MEN 2) or if someone in your family had these tumors eye disease, vision problems history of pancreatitis kidney disease stomach problems thyroid cancer or if someone in your family had thyroid cancer an unusual or allergic reaction to semaglutide, other medicines, foods, dyes, or preservatives pregnant or trying to get pregnant breast-feeding How should I use this medicine? This medicine is for injection under the skin of your upper leg (thigh), stomach area, or upper arm. It is given once every week (every 7 days). You will be taught how to prepare and give this medicine. Use exactly as directed. Take your medicine at regular intervals. Do not take it more often than directed. If you use this medicine with insulin, you should inject this medicine and the insulin separately. Do not mix them together. Do not give the injections right next to each other. Change (rotate) injection sites with each injection. It is important that you put your used needles and syringes in a special sharps container. Do not put them in a trash can. If you do not have a sharps container, call your pharmacist or healthcare provider to get one. A special MedGuide will be given to you by the pharmacist with each prescription and refill. Be sure to read this information carefully each time. This drug comes  with INSTRUCTIONS FOR USE. Ask your pharmacist for directions on how to use this drug. Read the information carefully. Talk to your pharmacist or health care provider if you have questions. Talk to your pediatrician regarding the use of this medicine in children. Special care may be needed. Overdosage: If you think you have taken too much of this medicine contact a poison control center or emergency room at once. NOTE: This medicine is only for you. Do not share this medicine with others. What if I miss a dose? If you miss a dose, take it as soon as you can within 5 days after the missed dose. Then take your next dose at your regular weekly time. If it has been longer than 5 days after the missed dose, do not take the missed dose. Take the next dose at your regular time. Do not take double or extra doses. If you have questions about a missed dose, contact your health care provider for advice. What may interact with this medicine? other medicines for diabetes Many medications may cause changes in blood sugar, these include: alcohol containing beverages antiviral medicines for HIV or AIDS aspirin and aspirin-like drugs certain medicines for blood pressure, heart disease, irregular heart beat chromium diuretics female hormones, such as estrogens or progestins, birth control pills fenofibrate gemfibrozil isoniazid lanreotide female hormones or anabolic steroids MAOIs like Carbex, Eldepryl, Marplan, Nardil, and Parnate medicines for weight loss medicines for allergies, asthma, cold, or cough medicines for depression, anxiety, or psychotic disturbances niacin nicotine NSAIDs, medicines for pain and inflammation, like ibuprofen or naproxen octreotide   pasireotide pentamidine phenytoin probenecid quinolone antibiotics such as ciprofloxacin, levofloxacin, ofloxacin some herbal dietary supplements steroid medicines such as prednisone or cortisone sulfamethoxazole; trimethoprim thyroid  hormones Some medications can hide the warning symptoms of low blood sugar (hypoglycemia). You may need to monitor your blood sugar more closely if you are taking one of these medications. These include: beta-blockers, often used for high blood pressure or heart problems (examples include atenolol, metoprolol, propranolol) clonidine guanethidine reserpine This list may not describe all possible interactions. Give your health care provider a list of all the medicines, herbs, non-prescription drugs, or dietary supplements you use. Also tell them if you smoke, drink alcohol, or use illegal drugs. Some items may interact with your medicine. What should I watch for while using this medicine? Visit your doctor or health care professional for regular checks on your progress. Drink plenty of fluids while taking this medicine. Check with your doctor or health care professional if you get an attack of severe diarrhea, nausea, and vomiting. The loss of too much body fluid can make it dangerous for you to take this medicine. A test called the HbA1C (A1C) will be monitored. This is a simple blood test. It measures your blood sugar control over the last 2 to 3 months. You will receive this test every 3 to 6 months. Learn how to check your blood sugar. Learn the symptoms of low and high blood sugar and how to manage them. Always carry a quick-source of sugar with you in case you have symptoms of low blood sugar. Examples include hard sugar candy or glucose tablets. Make sure others know that you can choke if you eat or drink when you develop serious symptoms of low blood sugar, such as seizures or unconsciousness. They must get medical help at once. Tell your doctor or health care professional if you have high blood sugar. You might need to change the dose of your medicine. If you are sick or exercising more than usual, you might need to change the dose of your medicine. Do not skip meals. Ask your doctor or health  care professional if you should avoid alcohol. Many nonprescription cough and cold products contain sugar or alcohol. These can affect blood sugar. Pens should never be shared. Even if the needle is changed, sharing may result in passing of viruses like hepatitis or HIV. Wear a medical ID bracelet or chain, and carry a card that describes your disease and details of your medicine and dosage times. Do not become pregnant while taking this medicine. Women should inform their doctor if they wish to become pregnant or think they might be pregnant. There is a potential for serious side effects to an unborn child. Talk to your health care professional or pharmacist for more information. What side effects may I notice from receiving this medicine? Side effects that you should report to your doctor or health care professional as soon as possible: allergic reactions like skin rash, itching or hives, swelling of the face, lips, or tongue breathing problems changes in vision diarrhea that continues or is severe lump or swelling on the neck severe nausea signs and symptoms of infection like fever or chills; cough; sore throat; pain or trouble passing urine signs and symptoms of low blood sugar such as feeling anxious, confusion, dizziness, increased hunger, unusually weak or tired, sweating, shakiness, cold, irritable, headache, blurred vision, fast heartbeat, loss of consciousness signs and symptoms of kidney injury like trouble passing urine or change in the amount of urine   trouble swallowing unusual stomach upset or pain vomiting Side effects that usually do not require medical attention (report to your doctor or health care professional if they continue or are bothersome): constipation diarrhea nausea pain, redness, or irritation at site where injected stomach upset This list may not describe all possible side effects. Call your doctor for medical advice about side effects. You may report side  effects to FDA at 1-800-FDA-1088. Where should I keep my medicine? Keep out of the reach of children. Store unopened pens in a refrigerator between 2 and 8 degrees C (36 and 46 degrees F). Do not freeze. Protect from light and heat. After you first use the pen, it can be stored for 56 days at room temperature between 15 and 30 degrees C (59 and 86 degrees F) or in a refrigerator. Throw away your used pen after 56 days or after the expiration date, whichever comes first. Do not store your pen with the needle attached. If the needle is left on, medicine may leak from the pen. NOTE: This sheet is a summary. It may not cover all possible information. If you have questions about this medicine, talk to your doctor, pharmacist, or health care provider.  2021 Elsevier/Gold Standard (2019-07-11 09:41:51)  

## 2021-01-17 ENCOUNTER — Telehealth: Payer: Self-pay

## 2021-01-17 LAB — CBC WITH DIFFERENTIAL/PLATELET
Basophils Absolute: 0.1 10*3/uL (ref 0.0–0.2)
Basos: 1 %
EOS (ABSOLUTE): 0.2 10*3/uL (ref 0.0–0.4)
Eos: 3 %
Hematocrit: 39.4 % (ref 34.0–46.6)
Hemoglobin: 12.8 g/dL (ref 11.1–15.9)
Immature Grans (Abs): 0 10*3/uL (ref 0.0–0.1)
Immature Granulocytes: 0 %
Lymphocytes Absolute: 2.3 10*3/uL (ref 0.7–3.1)
Lymphs: 38 %
MCH: 25.6 pg — ABNORMAL LOW (ref 26.6–33.0)
MCHC: 32.5 g/dL (ref 31.5–35.7)
MCV: 79 fL (ref 79–97)
Monocytes Absolute: 0.4 10*3/uL (ref 0.1–0.9)
Monocytes: 7 %
Neutrophils Absolute: 3.1 10*3/uL (ref 1.4–7.0)
Neutrophils: 51 %
Platelets: 392 10*3/uL (ref 150–450)
RBC: 5 x10E6/uL (ref 3.77–5.28)
RDW: 13.2 % (ref 11.7–15.4)
WBC: 6 10*3/uL (ref 3.4–10.8)

## 2021-01-17 LAB — COMPREHENSIVE METABOLIC PANEL
ALT: 24 IU/L (ref 0–32)
AST: 13 IU/L (ref 0–40)
Albumin/Globulin Ratio: 1 — ABNORMAL LOW (ref 1.2–2.2)
Albumin: 4.4 g/dL (ref 3.8–4.8)
Alkaline Phosphatase: 37 IU/L — ABNORMAL LOW (ref 44–121)
BUN/Creatinine Ratio: 12 (ref 9–23)
BUN: 12 mg/dL (ref 6–24)
Bilirubin Total: 0.4 mg/dL (ref 0.0–1.2)
CO2: 20 mmol/L (ref 20–29)
Calcium: 9.8 mg/dL (ref 8.7–10.2)
Chloride: 102 mmol/L (ref 96–106)
Creatinine, Ser: 1.04 mg/dL — ABNORMAL HIGH (ref 0.57–1.00)
Globulin, Total: 4.2 g/dL (ref 1.5–4.5)
Glucose: 101 mg/dL — ABNORMAL HIGH (ref 65–99)
Potassium: 4 mmol/L (ref 3.5–5.2)
Sodium: 141 mmol/L (ref 134–144)
Total Protein: 8.6 g/dL — ABNORMAL HIGH (ref 6.0–8.5)
eGFR: 68 mL/min/{1.73_m2} (ref >59)

## 2021-01-17 LAB — VITAMIN D 25 HYDROXY (VIT D DEFICIENCY, FRACTURES): Vit D, 25-Hydroxy: 35.6 ng/mL (ref 30.0–100.0)

## 2021-01-17 LAB — LIPID PANEL WITH LDL/HDL RATIO
Cholesterol, Total: 229 mg/dL — ABNORMAL HIGH (ref 100–199)
HDL: 56 mg/dL (ref >39)
LDL Chol Calc (NIH): 159 mg/dL — ABNORMAL HIGH (ref 0–99)
LDL/HDL Ratio: 2.8 ratio (ref 0.0–3.2)
Triglycerides: 81 mg/dL (ref 0–149)
VLDL Cholesterol Cal: 14 mg/dL (ref 5–40)

## 2021-01-17 LAB — TSH: TSH: 3.68 u[IU]/mL (ref 0.450–4.500)

## 2021-01-17 LAB — HEMOGLOBIN A1C
Est. average glucose Bld gHb Est-mCnc: 128 mg/dL
Hgb A1c MFr Bld: 6.1 % — ABNORMAL HIGH (ref 4.8–5.6)

## 2021-01-17 NOTE — Telephone Encounter (Signed)
Pt advised.   Thanks,   -Jamey Harman  

## 2021-01-17 NOTE — Telephone Encounter (Signed)
-----   Message from Margaretann Loveless, [place] sent at 01/17/2021  8:45 AM EST ----- Jonn Shingles,  Labs are looking a little worse compared to last time. This is most likely related to the weight gain. Luckily, with [place] trying to start Ozempic (hopefully getting it covered) this will help all the labs. I think getting labs rechecked in the summer when your biometric screen is due will be a good follow up time.   Daiva Nakayama, [facility]

## 2021-01-21 LAB — IGP, APTIMA HPV, RFX 16/18,45
HPV Aptima: NEGATIVE
PAP Smear Comment: 0

## 2021-01-21 LAB — IGP,APTIMA HPV,CTNG AGE GDLN

## 2021-01-30 ENCOUNTER — Telehealth (INDEPENDENT_AMBULATORY_CARE_PROVIDER_SITE_OTHER): Payer: Self-pay | Admitting: Gastroenterology

## 2021-01-30 ENCOUNTER — Other Ambulatory Visit: Payer: Self-pay

## 2021-01-30 DIAGNOSIS — Z1211 Encounter for screening for malignant neoplasm of colon: Secondary | ICD-10-CM

## 2021-01-30 MED ORDER — NA SULFATE-K SULFATE-MG SULF 17.5-3.13-1.6 GM/177ML PO SOLN: 1.0000 | Freq: Once | ORAL | 0 refills | Status: AC

## 2021-01-30 NOTE — Progress Notes (Signed)
Gastroenterology Pre-Procedure Review  Request Date: 02/14/21 Requesting Physician: Dr. Maximino Greenland  PATIENT REVIEW QUESTIONS: The patient responded to the following health history questions as indicated:    1. Are you having any GI issues? no 2. Do you have a personal history of Polyps? no 3. Do you have a family history of Colon Cancer or Polyps? no 4. Diabetes Mellitus? no 5. Joint replacements in the past 12 months?no 6. Major health problems in the past 3 months?no 7. Any artificial heart valves, MVP, or defibrillator?no    MEDICATIONS & ALLERGIES:    Patient reports the following regarding taking any anticoagulation/antiplatelet therapy:   Plavix, Coumadin, Eliquis, Xarelto, Lovenox, Pradaxa, Brilinta, or Effient? no Aspirin? no  Patient confirms/reports the following medications:  Current Outpatient Medications  Medication Sig Dispense Refill  . telmisartan (MICARDIS) 80 MG tablet TAKE 1 TABLET(80 MG) BY MOUTH DAILY 90 tablet 1  . triamterene-hydrochlorothiazide (MAXZIDE) 75-50 MG tablet Take 1 tablet by mouth daily. 90 tablet 1  . Vitamin D, Ergocalciferol, (DRISDOL) 1.25 MG (50000 UNIT) CAPS capsule TAKE 1 CAPSULE BY MOUTH EVERY 7 DAYS 12 capsule 1  . Semaglutide,0.25 or 0.5MG /DOS, (OZEMPIC, 0.25 OR 0.5 MG/DOSE,) 2 MG/1.5ML SOPN Inject 0.5 mg into the skin once a week. (Patient not taking: Reported on 01/30/2021) 1.5 mL 3   No current facility-administered medications for this visit.    Patient confirms/reports the following allergies:  No Known Allergies  No orders of the defined types were placed in this encounter.   AUTHORIZATION INFORMATION Primary Insurance: 1D#: Group #:  Secondary Insurance: 1D#: Group #:  SCHEDULE INFORMATION: Date: 02/14/21 Time: Location:[facility]

## 2021-01-31 ENCOUNTER — Other Ambulatory Visit: Payer: Self-pay

## 2021-01-31 DIAGNOSIS — Z1211 Encounter for screening for malignant neoplasm of colon: Secondary | ICD-10-CM

## 2021-02-28 ENCOUNTER — Encounter: Payer: Self-pay | Admitting: Certified Registered Nurse Anesthetist

## 2021-03-03 ENCOUNTER — Other Ambulatory Visit: Payer: Self-pay

## 2021-03-03 ENCOUNTER — Ambulatory Visit
Admission: RE | Admit: 2021-03-03 | Discharge: 2021-03-03 | Disposition: A | Discharge status: Home / Self Care | Payer: Managed Care, Other (non HMO) | Attending: Gastroenterology | Admitting: Gastroenterology

## 2021-03-03 ENCOUNTER — Encounter
Admission: RE | Disposition: A | Discharge status: Home / Self Care | Payer: Self-pay | Source: Home / Self Care | Attending: Gastroenterology

## 2021-03-03 ENCOUNTER — Encounter: Payer: Self-pay | Admitting: Gastroenterology

## 2021-03-03 DIAGNOSIS — Z538 Procedure and treatment not carried out for other reasons: Secondary | ICD-10-CM | POA: Insufficient documentation

## 2021-03-03 DIAGNOSIS — Z1211 Encounter for screening for malignant neoplasm of colon: Secondary | ICD-10-CM | POA: Diagnosis present

## 2021-03-03 SURGERY — COLONOSCOPY WITH PROPOFOL
Anesthesia: General

## 2021-03-03 MED ORDER — SODIUM CHLORIDE 0.9 % IV SOLN: INTRAVENOUS | Status: DC

## 2021-03-03 NOTE — H&P (Deleted)
Arlyss Repress, MD [address]  [address]  [place], [place] [address]  Main: [phone]  Fax: [phone] Pager: [phone]  Primary Care Physician:  Margaretann Loveless, PA-C Primary Gastroenterologist:  Dr. Arlyss Repress  Pre-Procedure History & Physical: HPI:  Amanda Ramsey is a 45 y.o. female is here for an colonoscopy.   Past Medical History:  Diagnosis Date  . Anemia   . Gallstones   . Hypertension     Past Surgical History:  Procedure Laterality Date  . ABDOMINAL HYSTERECTOMY  2011  . CHOLECYSTECTOMY      Prior to Admission medications   Medication Sig Start Date End Date Taking? Authorizing Provider  telmisartan (MICARDIS) 80 MG tablet TAKE 1 TABLET(80 MG) BY MOUTH DAILY 01/16/21  Yes Joycelyn Man M, PA-C  triamterene-hydrochlorothiazide (MAXZIDE) 75-50 MG tablet Take 1 tablet by mouth daily. 01/16/21  Yes Burnette, Alessandra Bevels, PA-C  Vitamin D, Ergocalciferol, (DRISDOL) 1.25 MG (50000 UNIT) CAPS capsule TAKE 1 CAPSULE BY MOUTH EVERY 7 DAYS 01/16/21  Yes Margaretann Loveless, PA-C  Semaglutide,0.25 or 0.5MG /DOS, (OZEMPIC, 0.25 OR 0.5 MG/DOSE,) 2 MG/1.5ML SOPN Inject 0.5 mg into the skin once a week. Patient not taking: No sig reported 01/16/21   Margaretann Loveless, PA-C    Allergies as of 01/30/2021  . (No Known Allergies)    Family History  Problem Relation Age of Onset  . Hypertension Mother   . Alcohol abuse Father   . Cirrhosis Father   . Seizures Father   . Hypertension Sister   . Breast cancer Neg Hx     Social History   Socioeconomic History  . Marital status: Single    Spouse name: Not on file  . Number of children: Not on file  . Years of education: Not on file  . Highest education level: Not on file  Occupational History  . Not on file  Tobacco Use  . Smoking status: Never Smoker  . Smokeless tobacco: Never Used  Vaping Use  . Vaping Use: Never used  Substance and Sexual Activity  . Alcohol use: Yes     Alcohol/week: 0.0 standard drinks    Comment: occasionally  . Drug use: No  . Sexual activity: Not Currently    Birth control/protection: Surgical  Other Topics Concern  . Not on file  Social History Narrative  . Not on file   Social Determinants of Health   Financial Resource Strain: Not on file  Food Insecurity: Not on file  Transportation Needs: Not on file  Physical Activity: Not on file  Stress: Not on file  Social Connections: Not on file  Intimate Partner Violence: Not on file    Review of Systems: See HPI, otherwise negative ROS  Physical Exam: BP 128/85   Pulse (!) 114   Temp 100.3 F (37.9 C) (Temporal)   Resp 20   Ht 5\' 7"  (1.702 m)   Wt 113.4 kg   SpO2 99%   BMI 39.16 kg/m  General:   Alert,  pleasant and cooperative in NAD Head:  Normocephalic and atraumatic. Neck:  Supple; no masses or thyromegaly. Lungs:  Clear throughout to auscultation.    Heart:  Regular rate and rhythm. Abdomen:  Soft, nontender and nondistended. Normal bowel sounds, without guarding, and without rebound.   Neurologic:  Alert and  oriented x4;  grossly normal neurologically.  Impression/Plan: is here for an colonoscopy to be performed for colon cancer screening  Risks, benefits, limitations, and  alternatives regarding  colonoscopy have been reviewed with the patient.  Questions have been answered.  All parties agreeable.   Lannette Donath, MD  03/03/2021, 9:28 AM

## 2021-03-03 NOTE — Progress Notes (Signed)
Patient had temp 100.3, HR 114, pt feeling woozy, mention to patient may want to go get herself checked out in ED or urgent care.

## 2021-04-11 ENCOUNTER — Other Ambulatory Visit: Payer: Self-pay | Admitting: Physician Assistant

## 2021-04-11 DIAGNOSIS — I10 Essential (primary) hypertension: Secondary | ICD-10-CM

## 2021-04-16 NOTE — Telephone Encounter (Signed)
Patient called about medication refill. She took her last pill today and is going out of town on Friday. Needs a refill ASAP please

## 2021-12-13 IMAGING — MG MM DIGITAL SCREENING BILAT W/ TOMO AND CAD
8 series · 8 of 24 positions shown · non-contrast
Comparison: Previous exam(s).

CLINICAL DATA: Screening.

EXAM:
DIGITAL SCREENING BILATERAL MAMMOGRAM WITH TOMOSYNTHESIS AND CAD
TECHNIQUE: Bilateral screening digital craniocaudal and mediolateral oblique
mammograms were obtained. Bilateral screening digital breast
tomosynthesis was performed. The images were evaluated with
computer-aided detection.

[R CC synth-2D]
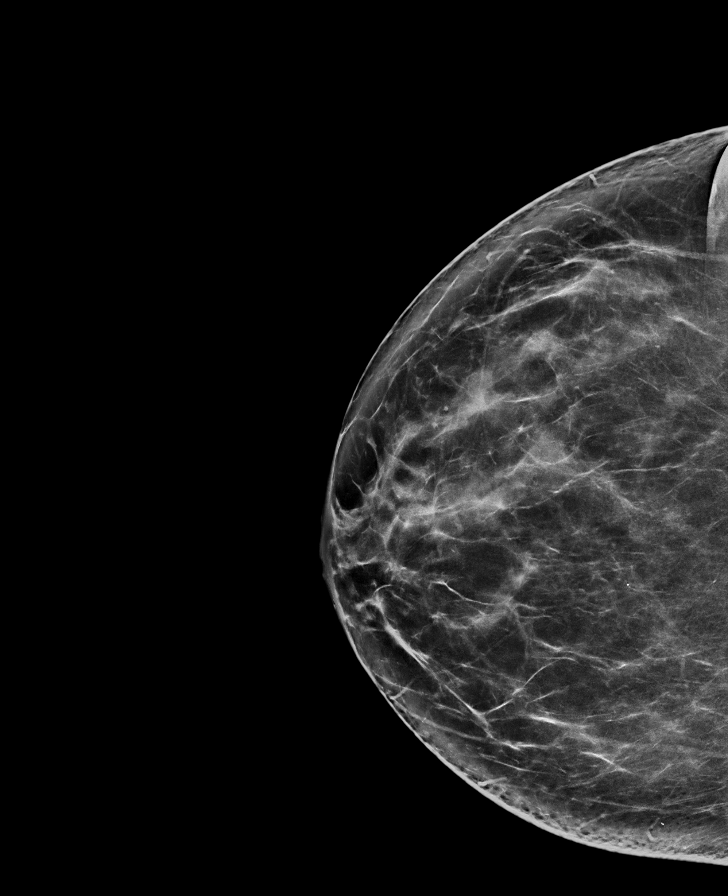

[L CC synth-2D]
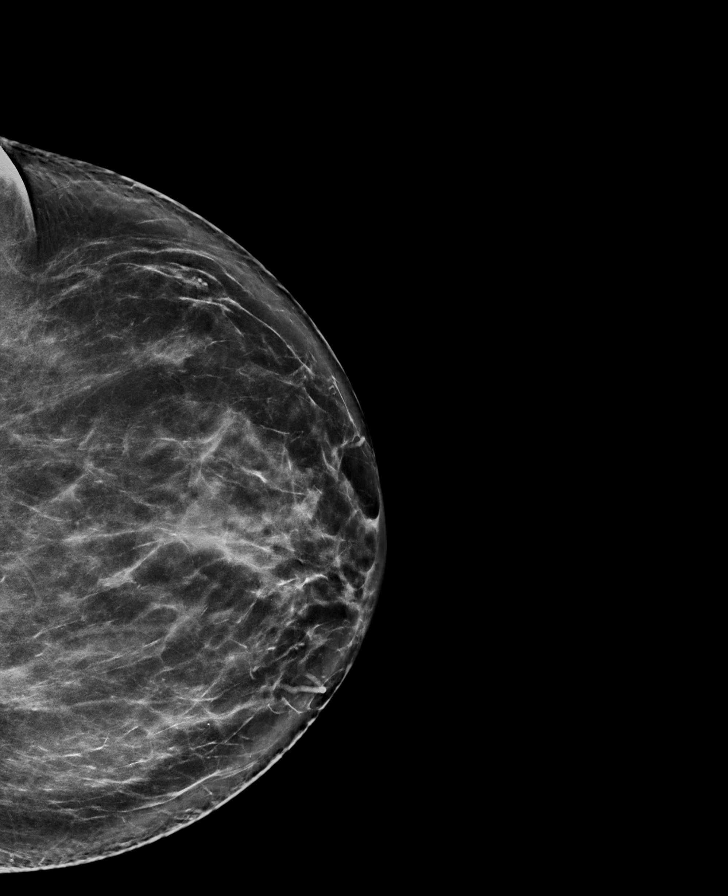

[R MLO synth-2D]
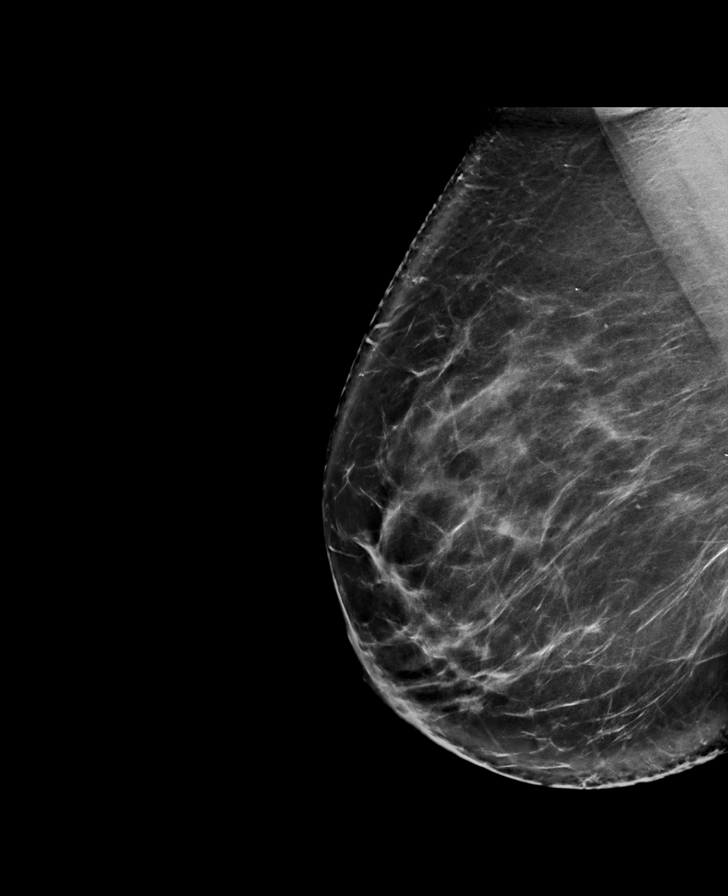

[L MLO synth-2D]
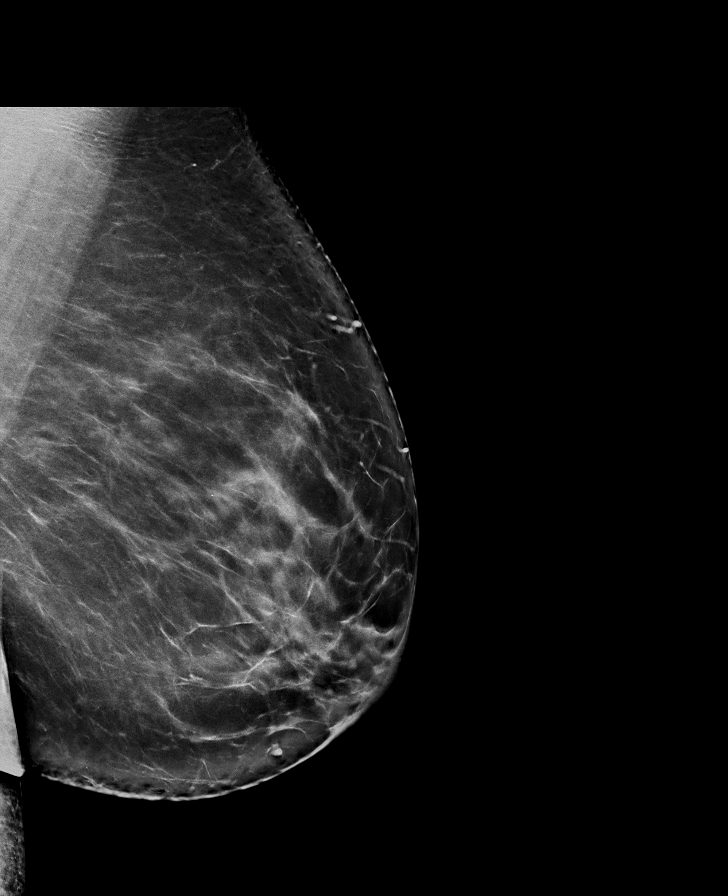

[L MLO tomo · tomo slice 55/110.0]
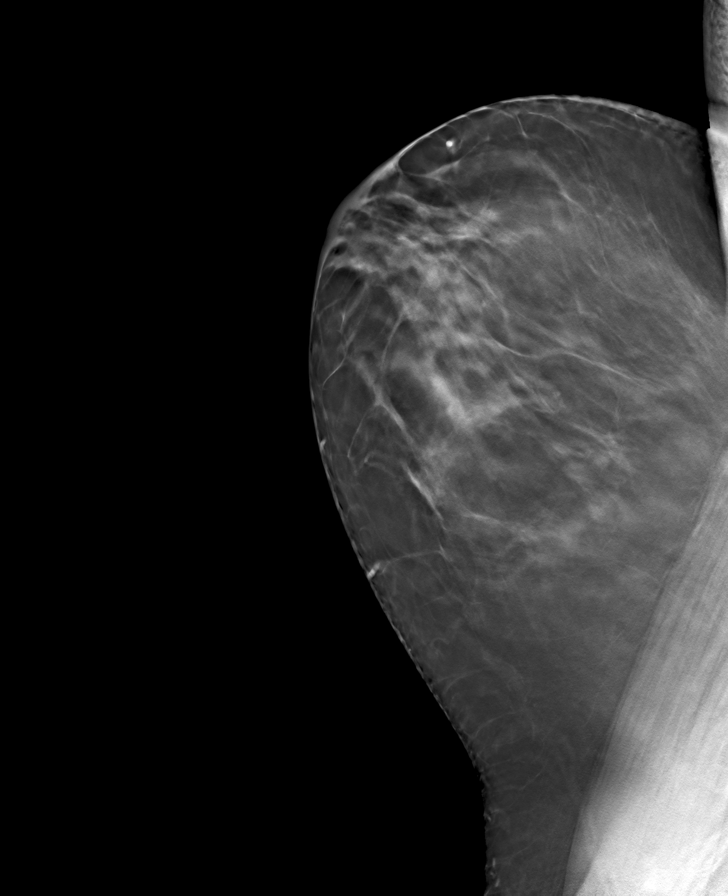

[R MLO tomo · tomo slice 53/106.0]
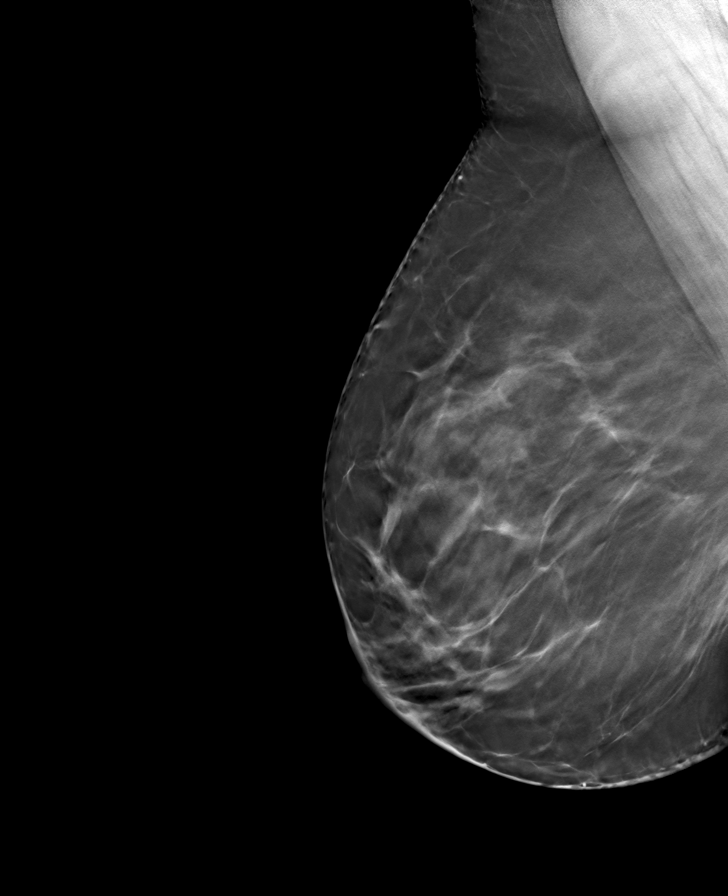

[L CC tomo · tomo slice 51/100.0]
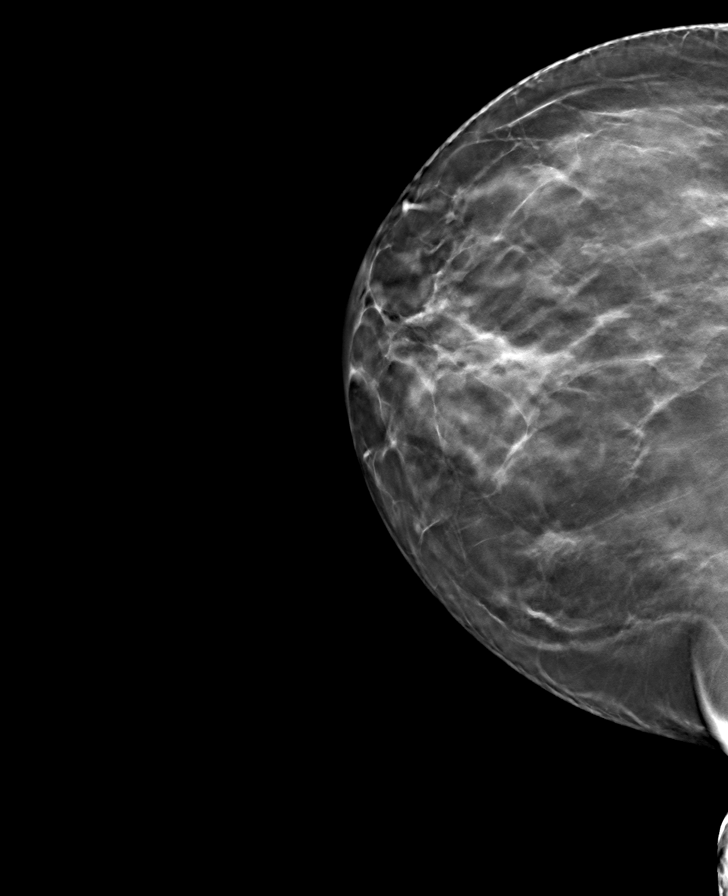

[R CC tomo · tomo slice 47/94.0]
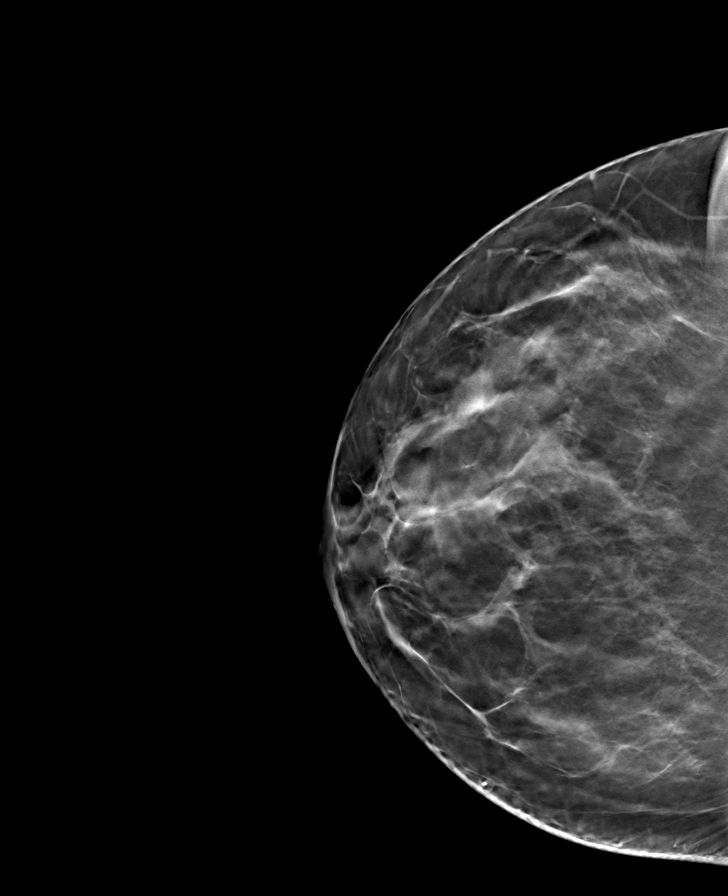

[8 of 24 positions shown; findings below may reference images not displayed]

ACR Breast Density Category c: The breast tissue is heterogeneously
dense, which may obscure small masses.
FINDINGS: There are no findings suspicious for malignancy.
IMPRESSION: No mammographic evidence of malignancy. A result letter of this
screening mammogram will be mailed directly to the patient.

RECOMMENDATION:
Screening mammogram in one year. (Code:Q3-W-BC3)

BI-RADS CATEGORY  1: Negative.

## 2022-05-21 ENCOUNTER — Telehealth: Payer: Self-pay | Admitting: Physician Assistant

## 2022-05-21 NOTE — Telephone Encounter (Signed)
Medical Records faxed to [facility] on 02/13/2022-electronically. Office did not receive records... Records were paper faxed on 05/21/2022. Patient was informed.

## 2022-10-19 NOTE — Progress Notes (Unsigned)
     I,Sha'taria Tyson,acting as a Neurosurgeon for [employer], PA-C.,have documented all relevant documentation on the behalf of Debera Lat, PA-C,as directed by  [employer], PA-C while in the presence of [employer], PA-C.   Established patient visit   Patient: Amanda Ramsey   DOB: Aug 24, 1976   46 y.o. Female  MRN: [id number] Visit Date: 10/20/2022  Today's healthcare provider: Debera Lat, PA-C   No chief complaint on file.  Subjective    HPI  Patient being seen to re-establish care. Last seen 01/16/21  Medications: Outpatient Medications Prior to Visit  Medication Sig   Semaglutide,0.25 or 0.5MG /DOS, (OZEMPIC, 0.25 OR 0.5 MG/DOSE,) 2 MG/1.5ML SOPN Inject 0.5 mg into the skin once a week. (Patient not taking: No sig reported)   telmisartan (MICARDIS) 80 MG tablet TAKE 1 TABLET(80 MG) BY MOUTH DAILY   triamterene-hydrochlorothiazide (MAXZIDE) 75-50 MG tablet Take 1 tablet by mouth daily.   Vitamin D, Ergocalciferol, (DRISDOL) 1.25 MG (50000 UNIT) CAPS capsule TAKE 1 CAPSULE BY MOUTH EVERY 7 DAYS   No facility-administered medications prior to visit.    Review of Systems  {Labs  Heme  Chem  Endocrine  Serology  Results Review (optional):23779}   Objective    There were no vitals taken for this visit. {Show previous vital signs (optional):23777}  Physical Exam  ***  No results found for any visits on 10/20/22.  Assessment & Plan     ***  No follow-ups on file.      {provider attestation***:1}   Debera Lat, Cordelia Poche  [facility] [phone] (phone) [phone] (fax)  [facility]

## 2022-10-20 ENCOUNTER — Ambulatory Visit: Payer: Managed Care, Other (non HMO) | Admitting: Physician Assistant

## 2022-10-20 ENCOUNTER — Encounter: Payer: Self-pay | Admitting: Physician Assistant

## 2022-10-20 VITALS — BP 103/78 | HR 85 | Ht 67.0 in | Wt 204.8 lb

## 2022-10-20 DIAGNOSIS — Z6838 Body mass index (BMI) 38.0-38.9, adult: Secondary | ICD-10-CM

## 2022-10-20 DIAGNOSIS — I10 Essential (primary) hypertension: Secondary | ICD-10-CM | POA: Diagnosis not present

## 2022-10-20 DIAGNOSIS — R7303 Prediabetes: Secondary | ICD-10-CM

## 2022-10-20 DIAGNOSIS — E049 Nontoxic goiter, unspecified: Secondary | ICD-10-CM

## 2022-10-20 DIAGNOSIS — Z1231 Encounter for screening mammogram for malignant neoplasm of breast: Secondary | ICD-10-CM | POA: Diagnosis not present

## 2022-10-20 MED ORDER — WEGOVY 2.4 MG/0.75ML ~~LOC~~ SOAJ: 2.4000 mg | SUBCUTANEOUS | 3 refills | Status: DC

## 2022-10-20 NOTE — Patient Instructions (Addendum)
Please contact [phone] to schedule your mammogram. You will be asked your location preference to have procedure performed. You have two options listed below.  1) [facility] located at [address] [place], [place] [address] 2) [facility] [place] located at [address] [place], [place] [address]  Upon results being received our office will contact you. As well as all results can be viewed through your MyChart. Please feel free to contact us if you have any further questions or concerns.   

## 2022-10-21 LAB — CBC WITH DIFFERENTIAL/PLATELET
Basophils Absolute: 0 10*3/uL (ref 0.0–0.2)
Basos: 0 %
EOS (ABSOLUTE): 0.1 10*3/uL (ref 0.0–0.4)
Eos: 2 %
Hematocrit: 34.6 % (ref 34.0–46.6)
Hemoglobin: 11 g/dL — ABNORMAL LOW (ref 11.1–15.9)
Immature Grans (Abs): 0 10*3/uL (ref 0.0–0.1)
Immature Granulocytes: 0 %
Lymphocytes Absolute: 2.5 10*3/uL (ref 0.7–3.1)
Lymphs: 37 %
MCH: 25 pg — ABNORMAL LOW (ref 26.6–33.0)
MCHC: 31.8 g/dL (ref 31.5–35.7)
MCV: 79 fL (ref 79–97)
Monocytes Absolute: 0.3 10*3/uL (ref 0.1–0.9)
Monocytes: 5 %
Neutrophils Absolute: 3.8 10*3/uL (ref 1.4–7.0)
Neutrophils: 56 %
Platelets: 383 10*3/uL (ref 150–450)
RBC: 4.4 x10E6/uL (ref 3.77–5.28)
RDW: 13.8 % (ref 11.7–15.4)
WBC: 6.8 10*3/uL (ref 3.4–10.8)

## 2022-10-21 LAB — COMPREHENSIVE METABOLIC PANEL
ALT: 13 IU/L (ref 0–32)
AST: 10 IU/L (ref 0–40)
Albumin/Globulin Ratio: 1.1 — ABNORMAL LOW (ref 1.2–2.2)
Albumin: 4.3 g/dL (ref 3.9–4.9)
Alkaline Phosphatase: 36 IU/L — ABNORMAL LOW (ref 44–121)
BUN/Creatinine Ratio: 13 (ref 9–23)
BUN: 21 mg/dL (ref 6–24)
Bilirubin Total: 0.3 mg/dL (ref 0.0–1.2)
CO2: 25 mmol/L (ref 20–29)
Calcium: 9.5 mg/dL (ref 8.7–10.2)
Chloride: 102 mmol/L (ref 96–106)
Creatinine, Ser: 1.57 mg/dL — ABNORMAL HIGH (ref 0.57–1.00)
Globulin, Total: 3.8 g/dL (ref 1.5–4.5)
Glucose: 91 mg/dL (ref 70–99)
Potassium: 4.1 mmol/L (ref 3.5–5.2)
Sodium: 142 mmol/L (ref 134–144)
Total Protein: 8.1 g/dL (ref 6.0–8.5)
eGFR: 41 mL/min/{1.73_m2} — ABNORMAL LOW (ref >59)

## 2022-10-21 LAB — HEMOGLOBIN A1C
Est. average glucose Bld gHb Est-mCnc: 114 mg/dL
Hgb A1c MFr Bld: 5.6 % (ref 4.8–5.6)

## 2022-10-21 LAB — TSH: TSH: 2.68 u[IU]/mL (ref 0.450–4.500)

## 2022-10-21 NOTE — Progress Notes (Signed)
Please, let pt know that her labs are stable for her except her kidney function decreased. She needs to drink 8-12 glasses of water every day, avoid NSAIDs, and have renal functions check avery 6-12 months to ensure stability. Her hemoglobin level decreased to 11 which is indicative of anemia. Her A1C 5.6 vs 6.1 1 year ago - improved.

## 2022-10-22 ENCOUNTER — Telehealth: Payer: Self-pay

## 2022-10-22 NOTE — Telephone Encounter (Signed)
Copied from CRM [id number]. Topic: General - Other >> Oct 22, 2022 11:21 AM Macon Large wrote: Reason for CRM: Pt stated a prior authorization is needed for the Rx for Semaglutide-Weight Management (WEGOVY) 2.4 MG/0.75ML SOAJ

## 2022-10-28 ENCOUNTER — Telehealth: Payer: Self-pay | Admitting: Physician Assistant

## 2022-10-28 NOTE — Telephone Encounter (Signed)
Please, let pt know that her Rx for wegovy was approved through 04/28/2023. Please, check with pharmacy and let us know if she will have any troubles

## 2022-10-28 NOTE — Telephone Encounter (Signed)
Patient advised.

## 2022-11-18 ENCOUNTER — Other Ambulatory Visit: Payer: Self-pay | Admitting: Physician Assistant

## 2022-11-18 DIAGNOSIS — I10 Essential (primary) hypertension: Secondary | ICD-10-CM

## 2022-11-18 MED ORDER — TELMISARTAN 80 MG PO TABS: ORAL_TABLET | ORAL | 0 refills | Status: DC

## 2022-11-18 NOTE — Telephone Encounter (Signed)
Requesting 90 day supply, almost out   Medication Refill - Medication: telmisartan (MICARDIS) 80 MG tablet   Has the patient contacted their pharmacy? Yes.   (Agent: If no, request that the patient contact the pharmacy for the refill. If patient does not wish to contact the pharmacy document the reason why and proceed with request.) (Agent: If yes, when and what did the pharmacy advise?)  Preferred Pharmacy (with phone number or street name):  [id number] - [place], [place] - [address]  [place] [address]  Phone: [phone] Fax: [phone]   Has the patient been seen for an appointment in the last year OR does the patient have an upcoming appointment? Yes.    Agent: Please be advised that RX refills may take up to 3 business days. We ask that you follow-up with your pharmacy.

## 2022-11-18 NOTE — Telephone Encounter (Signed)
Requested medication (s) are due for refill today: routing for review  Requested medication (s) are on the active medication list: yes  Last refill:  04/16/21  Future visit scheduled:yes  Notes to clinic:  Unable to refill per protocol, last refill by another provider not at practice. Another PCP listed     Requested Prescriptions  Pending Prescriptions Disp Refills   telmisartan (MICARDIS) 80 MG tablet 90 tablet 0     Cardiovascular:  Angiotensin Receptor Blockers Failed - 11/18/2022 12:28 PM      Failed - Cr in normal range and within 180 days    Creatinine, Ser  Date Value Ref Range Status  10/20/2022 1.57 (H) 0.57 - 1.00 mg/dL Final         Passed - K in normal range and within 180 days    Potassium  Date Value Ref Range Status  10/20/2022 4.1 3.5 - 5.2 mmol/L Final         Passed - Patient is not pregnant      Passed - Last BP in normal range    BP Readings from Last 1 Encounters:  10/20/22 103/78         Passed - Valid encounter within last 6 months    Recent Outpatient Visits           4 weeks ago Class 2 severe obesity due to excess calories with serious comorbidity and body mass index (BMI) of 38.0 to 38.9 in adult [facility])   [facility] [place], [place], PA-C   1 year ago Annual physical exam   [facility] [place], [place], [place]   2 years ago Acute right ankle pain   [facility] [place], [place], [place]   2 years ago Encounter for biometric screening   [place], [place], [place]   2 years ago Sore throat   [place], [place], [place]

## 2023-01-04 ENCOUNTER — Ambulatory Visit
Admission: RE | Admit: 2023-01-04 | Discharge: 2023-01-04 | Disposition: A | Discharge status: Home / Self Care | Payer: Managed Care, Other (non HMO) | Source: Ambulatory Visit | Attending: Physician Assistant | Admitting: Physician Assistant

## 2023-01-04 DIAGNOSIS — Z1231 Encounter for screening mammogram for malignant neoplasm of breast: Secondary | ICD-10-CM | POA: Diagnosis present

## 2023-01-06 NOTE — Progress Notes (Signed)
Please, let pt know that her mammogram negative and should could proceed with screening in a year.

## 2023-02-14 ENCOUNTER — Other Ambulatory Visit: Payer: Self-pay | Admitting: Physician Assistant

## 2023-02-14 DIAGNOSIS — I10 Essential (primary) hypertension: Secondary | ICD-10-CM

## 2023-02-16 NOTE — Telephone Encounter (Signed)
Requested Prescriptions  Pending Prescriptions Disp Refills   telmisartan (MICARDIS) 80 MG tablet [Pharmacy Med Name: TELMISARTAN 80MG  TABLETS] 90 tablet 0    Sig: TAKE 1 TABLET(80 MG) BY MOUTH DAILY     Cardiovascular:  Angiotensin Receptor Blockers Failed - 02/14/2023 11:05 AM      Failed - Cr in normal range and within 180 days    Creatinine, Ser  Date Value Ref Range Status  10/20/2022 1.57 (H) 0.57 - 1.00 mg/dL Final         Passed - K in normal range and within 180 days    Potassium  Date Value Ref Range Status  10/20/2022 4.1 3.5 - 5.2 mmol/L Final         Passed - Patient is not pregnant      Passed - Last BP in normal range    BP Readings from Last 1 Encounters:  10/20/22 103/78         Passed - Valid encounter within last 6 months    Recent Outpatient Visits           3 months ago Class 2 severe obesity due to excess calories with serious comorbidity and body mass index (BMI) of 38.0 to 38.9 in adult [facility])   [facility] [place], [place], PA-C   2 years ago Annual physical exam   [facility] [place], [place], [place]   2 years ago Acute right ankle pain   [facility] [place], Alessandra Bevels, [place]   2 years ago Encounter for biometric screening   [facility] [place], [place], [place]   2 years ago Sore throat   [facility] [place], Ricki Rodriguez [place], [place]

## 2023-03-07 ENCOUNTER — Other Ambulatory Visit: Payer: Self-pay | Admitting: Physician Assistant

## 2023-03-07 DIAGNOSIS — R7303 Prediabetes: Secondary | ICD-10-CM

## 2023-03-07 DIAGNOSIS — I10 Essential (primary) hypertension: Secondary | ICD-10-CM

## 2023-03-09 NOTE — Telephone Encounter (Signed)
Requested Prescriptions  Pending Prescriptions Disp Refills   WEGOVY 2.4 MG/0.75ML SOAJ [Pharmacy Med Name: WEGOVY 2.4MG /0.75ML PF PEN INJ] 3 mL 1    Sig: ADMINISTER 2.4 MG UNDER THE SKIN 1 TIME A WEEK     Endocrinology:  Diabetes - GLP-1 Receptor Agonists - semaglutide Failed - 03/07/2023  9:07 PM      Failed - Cr in normal range and within 360 days    Creatinine, Ser  Date Value Ref Range Status  10/20/2022 1.57 (H) 0.57 - 1.00 mg/dL Final         Passed - HBA1C in normal range and within 180 days    Hgb A1c MFr Bld  Date Value Ref Range Status  10/20/2022 5.6 4.8 - 5.6 % Final    Comment:             Prediabetes: 5.7 - 6.4          Diabetes: >6.4          Glycemic control for adults with diabetes: <7.0          Passed - Valid encounter within last 6 months    Recent Outpatient Visits           4 months ago Class 2 severe obesity due to excess calories with serious comorbidity and body mass index (BMI) of 38.0 to 38.9 in adult [facility])   [facility] [place], [place], PA-C   2 years ago Annual physical exam   [facility] [place], [place], [place]   2 years ago Acute right ankle pain   [facility] [place], Alessandra Bevel[place], [place]   2 years ago Encounter for biometric screening   [facility] [place], [place], [place]   3 years ago Sore throat   [facility] [place], Ricki Rodriguez [place], [place]

## 2023-03-12 ENCOUNTER — Ambulatory Visit
Admission: RE | Admit: 2023-03-12 | Discharge: 2023-03-12 | Disposition: A | Discharge status: Home / Self Care | Payer: Managed Care, Other (non HMO) | Attending: Physician Assistant | Admitting: Physician Assistant

## 2023-03-12 ENCOUNTER — Ambulatory Visit
Admission: RE | Admit: 2023-03-12 | Discharge: 2023-03-12 | Disposition: A | Discharge status: Home / Self Care | Payer: Managed Care, Other (non HMO) | Source: Ambulatory Visit | Attending: Physician Assistant

## 2023-03-12 ENCOUNTER — Ambulatory Visit: Payer: Managed Care, Other (non HMO) | Admitting: Physician Assistant

## 2023-03-12 ENCOUNTER — Encounter: Payer: Self-pay | Admitting: Physician Assistant

## 2023-03-12 VITALS — BP 140/92 | HR 71 | Temp 98.2°F | Ht 67.0 in | Wt 195.3 lb

## 2023-03-12 DIAGNOSIS — M7989 Other specified soft tissue disorders: Secondary | ICD-10-CM | POA: Diagnosis present

## 2023-03-12 DIAGNOSIS — M79672 Pain in left foot: Secondary | ICD-10-CM

## 2023-03-12 MED ORDER — PREDNISONE 10 MG (21) PO TBPK: ORAL_TABLET | ORAL | 0 refills | Status: DC

## 2023-03-12 NOTE — Progress Notes (Unsigned)
    I,Sha'taria Tyson,acting as a Neurosurgeon for [employer], PA-C.,have documented all relevant documentation on the behalf of Debera Lat, PA-C,as directed by  [employer], PA-C while in the presence of [employer], PA-C.   Established patient visit   Patient: Amanda Ramsey   DOB: December 10, 1975   47 y.o. Female  MRN: [id number] Visit Date: 03/12/2023  Today's healthcare provider: Debera Lat, PA-C   No chief complaint on file.  Subjective    HPI  Patient reports left foot pain began Monday but has gotten worse and began swelling some point on Wednesday reports swelling has gotten worse. She states this has previously happened years ago but opposite foot.   Medications: Outpatient Medications Prior to Visit  Medication Sig   Semaglutide-Weight Management (WEGOVY) 2.4 MG/0.75ML SOAJ ADMINISTER 2.4 MG UNDER THE SKIN 1 TIME A WEEK   telmisartan (MICARDIS) 80 MG tablet TAKE 1 TABLET(80 MG) BY MOUTH DAILY   triamterene-hydrochlorothiazide (MAXZIDE) 75-50 MG tablet Take 1 tablet by mouth daily.   Vitamin D, Ergocalciferol, (DRISDOL) 1.25 MG (50000 UNIT) CAPS capsule TAKE 1 CAPSULE BY MOUTH EVERY 7 DAYS   No facility-administered medications prior to visit.    Review of Systems  {Labs  Heme  Chem  Endocrine  Serology  Results Review (optional):23779}   Objective    There were no vitals taken for this visit. {Show previous vital signs (optional):23777}  Physical Exam  ***  No results found for any visits on 03/12/23.  Assessment & Plan     ***  No follow-ups on file.      {provider attestation***:1}   Debera Lat, PA-C  [facility] [phone] (phone) [phone] (fax)  [facility]

## 2023-03-13 LAB — CBC WITH DIFFERENTIAL/PLATELET
Basophils Absolute: 0.1 10*3/uL (ref 0.0–0.2)
Basos: 1 %
EOS (ABSOLUTE): 0.1 10*3/uL (ref 0.0–0.4)
Eos: 2 %
Hematocrit: 37.1 % (ref 34.0–46.6)
Hemoglobin: 11.8 g/dL (ref 11.1–15.9)
Immature Grans (Abs): 0 10*3/uL (ref 0.0–0.1)
Immature Granulocytes: 0 %
Lymphocytes Absolute: 2.7 10*3/uL (ref 0.7–3.1)
Lymphs: 47 %
MCH: 25.1 pg — ABNORMAL LOW (ref 26.6–33.0)
MCHC: 31.8 g/dL (ref 31.5–35.7)
MCV: 79 fL (ref 79–97)
Monocytes Absolute: 0.3 10*3/uL (ref 0.1–0.9)
Monocytes: 5 %
Neutrophils Absolute: 2.6 10*3/uL (ref 1.4–7.0)
Neutrophils: 45 %
Platelets: 358 10*3/uL (ref 150–450)
RBC: 4.7 x10E6/uL (ref 3.77–5.28)
RDW: 13.2 % (ref 11.7–15.4)
WBC: 5.7 10*3/uL (ref 3.4–10.8)

## 2023-03-13 LAB — URIC ACID: Uric Acid: 5.5 mg/dL (ref 2.6–6.2)

## 2023-03-13 LAB — SEDIMENTATION RATE: Sed Rate: 48 mm/hr — ABNORMAL HIGH (ref 0–32)

## 2023-03-13 LAB — C-REACTIVE PROTEIN: CRP: 17 mg/L — ABNORMAL HIGH (ref 0–10)

## 2023-03-15 NOTE — Progress Notes (Signed)
Hello Holley Raring ,   Your labwork results all are within normal limits and stable for you except elevated sedimentation rate /CRP which indicates inflammation. Results for your x-ray are pending  Please let me know if you are doing better  Best, Debera Lat, PA-C

## 2023-03-18 NOTE — Progress Notes (Signed)
Please, let pt know that her XR negative, without abnormalities.

## 2023-03-31 ENCOUNTER — Encounter: Payer: Self-pay | Admitting: Physician Assistant

## 2023-03-31 ENCOUNTER — Ambulatory Visit: Payer: Managed Care, Other (non HMO) | Admitting: Physician Assistant

## 2023-03-31 VITALS — BP 129/87 | HR 73 | Ht 67.0 in | Wt 198.0 lb

## 2023-03-31 DIAGNOSIS — I10 Essential (primary) hypertension: Secondary | ICD-10-CM | POA: Diagnosis not present

## 2023-03-31 DIAGNOSIS — Z0000 Encounter for general adult medical examination without abnormal findings: Secondary | ICD-10-CM | POA: Diagnosis not present

## 2023-03-31 DIAGNOSIS — E559 Vitamin D deficiency, unspecified: Secondary | ICD-10-CM

## 2023-03-31 MED ORDER — TRIAMTERENE-HCTZ 75-50 MG PO TABS: 1.0000 | ORAL_TABLET | Freq: Every day | ORAL | 1 refills | Status: DC

## 2023-03-31 NOTE — Progress Notes (Signed)
Established patient visit  Patient: Amanda Ramsey   DOB: Apr 26, 1976   47 y.o. Female  MRN: [id number] Visit Date: 03/31/2023  Today's healthcare provider: Debera Lat, PA-C   Chief Complaint  Patient presents with   Annual Exam   Subjective    HPI  Well Adult Physical: Patient here for a comprehensive physical exam. Do you take any herbs or supplements that were not prescribed by a doctor? no Are you taking calcium supplements? no Are you taking aspirin daily? no GU History: LMP: No LMP recorded. Patient has had a hysterectomy. Menopause 23-24 years Next pap date: next in 2027 Abnormal pap? no     03/12/2023    1:29 PM 10/20/2022   10:15 AM 01/16/2021    8:32 AM  Depression screen PHQ 2/9  Decreased Interest 0 0 0  Down, Depressed, Hopeless 0 0 0  PHQ - 2 Score 0 0 0  Altered sleeping 1 0 1  Tired, decreased energy 0 0 0  Change in appetite 0 0 0  Feeling bad or failure about yourself  0 0 0  Trouble concentrating 0 0 0  Moving slowly or fidgety/restless 0 0 0  Suicidal thoughts 0 0 0  PHQ-9 Score 1 0 1  Difficult doing work/chores Not difficult at all Not difficult at all Not difficult at all   Medications: Outpatient Medications Prior to Visit  Medication Sig   predniSONE (STERAPRED UNI-PAK 21 TAB) 10 MG (21) TBPK tablet Take as directed on package instructions (Patient not taking: Reported on 06/14/2018), Disp: 21 tablet, Rfl: 0   Semaglutide-Weight Management (WEGOVY) 2.4 MG/0.75ML SOAJ ADMINISTER 2.4 MG UNDER THE SKIN 1 TIME A WEEK   telmisartan (MICARDIS) 80 MG tablet TAKE 1 TABLET(80 MG) BY MOUTH DAILY   Vitamin D, Ergocalciferol, (DRISDOL) 1.25 MG (50000 UNIT) CAPS capsule TAKE 1 CAPSULE BY MOUTH EVERY 7 DAYS   [DISCONTINUED] triamterene-hydrochlorothiazide (MAXZIDE) 75-50 MG tablet Take 1 tablet by mouth daily.   No facility-administered medications prior to visit.    Review of Systems  All other systems reviewed and are negative. Except see HPI       Objective    BP 129/87 (BP Location: Left Arm, Patient Position: Sitting, Cuff Size: Large)   Pulse 73   Ht 5\' 7"  (1.702 m)   Wt 198 lb (89.8 kg)   SpO2 100%   BMI 31.01 kg/m    Physical Exam Vitals reviewed.  Constitutional:      General: She is not in acute distress.    Appearance: Normal appearance. She is well-developed. She is not ill-appearing, toxic-appearing or diaphoretic.  HENT:     Head: Normocephalic and atraumatic.     Right Ear: Tympanic membrane, ear canal and external ear normal.     Left Ear: Tympanic membrane, ear canal and external ear normal.     Nose: Nose normal. No congestion or rhinorrhea.     Mouth/Throat:     Mouth: Mucous membranes are moist.     Pharynx: Oropharynx is clear. No oropharyngeal exudate or posterior oropharyngeal erythema.  Eyes:     General: No scleral icterus.       Right eye: No discharge.        Left eye: No discharge.     Conjunctiva/sclera: Conjunctivae normal.     Pupils: Pupils are equal, round, and reactive to light.  Neck:     Thyroid: No thyromegaly.     Vascular: No carotid bruit.  Cardiovascular:     Rate  and Rhythm: Normal rate and regular rhythm.     Pulses: Normal pulses.     Heart sounds: Normal heart sounds. No murmur heard.    No friction rub. No gallop.  Pulmonary:     Effort: Pulmonary effort is normal. No respiratory distress.     Breath sounds: Normal breath sounds. No wheezing or rales.  Chest:     Chest wall: No mass, lacerations, deformity, swelling, tenderness, crepitus or edema. There is no dullness to percussion.  Breasts:    Tanner Score is 5.     Right: Normal. No swelling, bleeding, inverted nipple, mass, nipple discharge, skin change or tenderness.     Left: Normal. No swelling, bleeding, inverted nipple, mass, nipple discharge, skin change or tenderness.  Abdominal:     General: Abdomen is flat. Bowel sounds are normal. There is no distension.     Palpations: Abdomen is soft. There is no mass.      Tenderness: There is no abdominal tenderness. There is no right CVA tenderness, left CVA tenderness, guarding or rebound.     Hernia: No hernia is present. There is no hernia in the left inguinal area or right inguinal area.  Genitourinary:    Exam position: Lithotomy position.     Pubic Area: No rash or pubic lice.      Tanner stage (genital): 5.     Labia:        Right: No rash, tenderness, lesion or injury.        Left: No rash, tenderness, lesion or injury.      Urethra: No prolapse, urethral pain, urethral swelling or urethral lesion.     Vagina: Vaginal discharge present.     Cervix: Normal.     Uterus: Normal.      Adnexa: Right adnexa normal and left adnexa normal.     Rectum: Normal.  Musculoskeletal:        General: No swelling, tenderness, deformity or signs of injury. Normal range of motion.     Cervical back: Normal range of motion and neck supple. No rigidity or tenderness.     Right lower leg: No edema.     Left lower leg: No edema.  Lymphadenopathy:     Cervical: No cervical adenopathy.     Upper Body:     Right upper body: No supraclavicular or axillary adenopathy.     Left upper body: No supraclavicular or axillary adenopathy.     Lower Body: No right inguinal adenopathy. No left inguinal adenopathy.  Skin:    General: Skin is warm and dry.     Coloration: Skin is not jaundiced or pale.     Findings: No bruising, erythema, lesion or rash.  Neurological:     Mental Status: She is alert and oriented to person, place, and time. Mental status is at baseline.     Gait: Gait normal.  Psychiatric:        Mood and Affect: Mood normal.        Behavior: Behavior normal.        Thought Content: Thought content normal.        Judgment: Judgment normal.     Pelvic exam was deferred  Results for orders placed or performed in visit on 03/31/23  Hemoglobin A1c  Result Value Ref Range   Hgb A1c MFr Bld 5.7 (H) 4.8 - 5.6 %   Est. average glucose Bld gHb Est-mCnc 117  mg/dL  Lipid panel  Result Value Ref Range  Cholesterol, Total 228 (H) 100 - 199 mg/dL   Triglycerides 59 0 - 149 mg/dL   HDL 66 >16 mg/dL   VLDL Cholesterol Cal 10 5 - 40 mg/dL   LDL Chol Calc (NIH) 109 (H) 0 - 99 mg/dL   Chol/HDL Ratio 3.5 0.0 - 4.4 ratio  Comprehensive metabolic panel  Result Value Ref Range   Glucose 88 70 - 99 mg/dL   BUN 20 6 - 24 mg/dL   Creatinine, Ser 6.04 (H) 0.57 - 1.00 mg/dL   eGFR 44 (L) >54 UJ/WJX/9.14   BUN/Creatinine Ratio 14 9 - 23   Sodium 142 134 - 144 mmol/L   Potassium 4.6 3.5 - 5.2 mmol/L   Chloride 102 96 - 106 mmol/L   CO2 23 20 - 29 mmol/L   Calcium 9.7 8.7 - 10.2 mg/dL   Total Protein 7.5 6.0 - 8.5 g/dL   Albumin 4.1 3.9 - 4.9 g/dL   Globulin, Total 3.4 1.5 - 4.5 g/dL   Albumin/Globulin Ratio 1.2 1.2 - 2.2   Bilirubin Total 0.4 0.0 - 1.2 mg/dL   Alkaline Phosphatase 42 (L) 44 - 121 IU/L   AST 17 0 - 40 IU/L   ALT 29 0 - 32 IU/L  Vitamin D (25 hydroxy)  Result Value Ref Range   Vit D, 25-Hydroxy 80.0 30.0 - 100.0 ng/mL    Assessment & Plan    1. Essential (primary) hypertension Chronic and stable BP today WNL Refill was called in: - triamterene-hydrochlorothiazide (MAXZIDE) 75-50 MG tablet; Take 1 tablet by mouth daily.  Dispense: 90 tablet; Refill: 1 Encouraged low salt diet and daily exercise Workup ordered: - Hemoglobin A1c - Lipid panel - Comprehensive metabolic panel - Vitamin D (25 hydroxy) Will reassess after  receiving lab results  2. Avitaminosis D In the past, take daily vit D Up to 50,000IU - Comprehensive metabolic panel - Vitamin D (25 hydroxy) adjust the dose of vit D after assessing lab results.  Annual physical exam UTD on dental/eye Things to do to keep yourself healthy  - Exercise at least 30-45 minutes a day, 3-4 days a week.  - Eat a low-fat diet with lots of fruits and vegetables, up to 7-9 servings per day.  - Seatbelts can save your life. Wear them always.  - Smoke detectors on every level of  your home, check batteries every year.  - Eye Doctor - have an eye exam every 1-2 years  - Safe sex - if you may be exposed to STDs, use a condom.  - Alcohol -  If you drink, do it moderately, less than 2 drinks per day.  - Health Care Power of Attorney. Choose someone to speak for you if you are not able.  - Depression is common in our stressful world.If you're feeling down or losing interest in things you normally enjoy, please come in for a visit.  - Violence - If anyone is threatening or hurting you, please call immediately.  Return in about 1 year (around 03/30/2024) for CPE.     The patient was advised to call back or seek an in-person evaluation if the symptoms worsen or if the condition fails to improve as anticipated.  I discussed the assessment and treatment plan with the patient. The patient was provided an opportunity to ask questions and all were answered. The patient agreed with the plan and demonstrated an understanding of the instructions.  I, Debera Lat, PA-C have reviewed all documentation for this visit. The documentation on  03/31/23  for the exam, diagnosis, procedures, and orders are all accurate and complete.  Debera Lat, [facility], MMS [facility] [phone] (phone) [phone] (fax)  [facility]

## 2023-04-01 LAB — COMPREHENSIVE METABOLIC PANEL
ALT: 29 IU/L (ref 0–32)
AST: 17 IU/L (ref 0–40)
Albumin/Globulin Ratio: 1.2 (ref 1.2–2.2)
Albumin: 4.1 g/dL (ref 3.9–4.9)
Alkaline Phosphatase: 42 IU/L — ABNORMAL LOW (ref 44–121)
BUN/Creatinine Ratio: 14 (ref 9–23)
BUN: 20 mg/dL (ref 6–24)
Bilirubin Total: 0.4 mg/dL (ref 0.0–1.2)
CO2: 23 mmol/L (ref 20–29)
Calcium: 9.7 mg/dL (ref 8.7–10.2)
Chloride: 102 mmol/L (ref 96–106)
Creatinine, Ser: 1.46 mg/dL — ABNORMAL HIGH (ref 0.57–1.00)
Globulin, Total: 3.4 g/dL (ref 1.5–4.5)
Glucose: 88 mg/dL (ref 70–99)
Potassium: 4.6 mmol/L (ref 3.5–5.2)
Sodium: 142 mmol/L (ref 134–144)
Total Protein: 7.5 g/dL (ref 6.0–8.5)
eGFR: 44 mL/min/{1.73_m2} — ABNORMAL LOW (ref >59)

## 2023-04-01 LAB — HEMOGLOBIN A1C
Est. average glucose Bld gHb Est-mCnc: 117 mg/dL
Hgb A1c MFr Bld: 5.7 % — ABNORMAL HIGH (ref 4.8–5.6)

## 2023-04-01 LAB — VITAMIN D 25 HYDROXY (VIT D DEFICIENCY, FRACTURES): Vit D, 25-Hydroxy: 80 ng/mL (ref 30.0–100.0)

## 2023-04-01 LAB — LIPID PANEL
Chol/HDL Ratio: 3.5 ratio (ref 0.0–4.4)
Cholesterol, Total: 228 mg/dL — ABNORMAL HIGH (ref 100–199)
HDL: 66 mg/dL (ref >39)
LDL Chol Calc (NIH): 152 mg/dL — ABNORMAL HIGH (ref 0–99)
Triglycerides: 59 mg/dL (ref 0–149)
VLDL Cholesterol Cal: 10 mg/dL (ref 5–40)

## 2023-04-02 NOTE — Progress Notes (Signed)
Please, let pt that her labs stable for you except - Elevated lipids. Advised low fa/cholesterol diet and regular exercise - Decreased kidney function. She needs to drink 8-12 glasses of water every day, avoid NSAIDs, and have renal functions check avery 6-12 months to ensure stability.Will refer to kidney specialist if kidney function stays the same. - A1C 5.7 Advised continue wegovy and adhere to low carb diet and daily exercise. - Vit D was WNL, continue with 1000IU of vit D daily

## 2023-04-19 ENCOUNTER — Telehealth: Payer: Self-pay

## 2023-04-19 NOTE — Telephone Encounter (Signed)
PA started today.  

## 2023-04-19 NOTE — Telephone Encounter (Signed)
[facility] pharmacy advised and patient.

## 2023-04-19 NOTE — Telephone Encounter (Signed)
-----   Message from Unknown Foley sent at 04/16/2023  3:26 PM EDT ----- Please review incoming fax

## 2023-04-19 NOTE — Telephone Encounter (Signed)
Your prior authorization for Reginal Lutes has been approved! MORE INFO Personalized support and financial assistance may be available through the [employer] program. For more information, and to see program requirements, click on the More Info button to the right.  Message from plan: Request Reference Number: [id number]. WEGOVY INJ 2.4MG  is approved through 10/19/2023. Your patient may now fill this prescription and it will be covered.. Authorization Expiration Date: October 19, 2023.

## 2023-04-28 ENCOUNTER — Encounter: Payer: Self-pay | Admitting: Physician Assistant

## 2023-04-28 ENCOUNTER — Ambulatory Visit: Payer: Managed Care, Other (non HMO) | Admitting: Physician Assistant

## 2023-04-28 VITALS — BP 95/78 | HR 99 | Temp 99.0°F | Wt 194.0 lb

## 2023-04-28 DIAGNOSIS — U071 COVID-19: Secondary | ICD-10-CM

## 2023-04-28 LAB — POC COVID19 BINAXNOW: SARS Coronavirus 2 Ag: POSITIVE — AB

## 2023-04-28 NOTE — Progress Notes (Signed)
Vivien Rota DeSanto,acting as a Neurosurgeon for [employer], PA-C.,have documented all relevant documentation on the behalf of Debera Lat, PA-C,as directed by  [employer], PA-C while in the presence of [employer], PA-C.     Established patient visit   Patient: Amanda Ramsey   DOB: Oct 07, 1976   47 y.o. Female  MRN: [id number] Visit Date: 04/28/2023  Today's healthcare provider: Debera Lat, PA-C   Chief Complaint  Patient presents with   URI   Subjective    HPI  Patient is a 47 year old female who presents for possible upper respiratory infections.  She states her symptoms began yesterday with fever and congestion.  She is very fatigued and states she gets slightly winded when she exerts herself but does not feel short of breath.  She took Tylenol cold tablet last with no relief.   She lives alone and works from home.  However she was in a crowd on Saturday.  She does not work out in the yard.    Medications: Outpatient Medications Prior to Visit  Medication Sig   Semaglutide-Weight Management (WEGOVY) 2.4 MG/0.75ML SOAJ ADMINISTER 2.4 MG UNDER THE SKIN 1 TIME A WEEK   telmisartan (MICARDIS) 80 MG tablet TAKE 1 TABLET(80 MG) BY MOUTH DAILY   triamterene-hydrochlorothiazide (MAXZIDE) 75-50 MG tablet Take 1 tablet by mouth daily.   [DISCONTINUED] predniSONE (STERAPRED UNI-PAK 21 TAB) 10 MG (21) TBPK tablet Take as directed on package instructions (Patient not taking: Reported on 06/14/2018), Disp: 21 tablet, Rfl: 0   [DISCONTINUED] Vitamin D, Ergocalciferol, (DRISDOL) 1.25 MG (50000 UNIT) CAPS capsule TAKE 1 CAPSULE BY MOUTH EVERY 7 DAYS   No facility-administered medications prior to visit.    Review of Systems  Constitutional:  Positive for chills, diaphoresis, fatigue and fever.  HENT:  Positive for congestion and rhinorrhea. Negative for ear discharge, ear pain, facial swelling, hearing loss, postnasal drip, sinus pressure, sinus pain, sneezing, sore throat, tinnitus,  trouble swallowing and voice change.   Eyes:  Negative for photophobia, pain, discharge, redness, itching and visual disturbance.  Respiratory:  Negative for cough, shortness of breath and wheezing.   Cardiovascular:  Negative for chest pain, palpitations and leg swelling.  Gastrointestinal:  Negative for abdominal pain, constipation, diarrhea, nausea and vomiting.  Musculoskeletal:  Negative for myalgias.  Neurological:  Negative for dizziness, light-headedness and headaches.       Objective    BP 95/78 (BP Location: Left Arm, Patient Position: Sitting, Cuff Size: Normal)   Pulse 99   Temp 99 F (37.2 C) (Oral)   Wt 194 lb (88 kg)   SpO2 100%   BMI 30.38 kg/m    Physical Exam Vitals reviewed.  Constitutional:      General: She is not in acute distress.    Appearance: Normal appearance. She is well-developed. She is not diaphoretic.  HENT:     Head: Normocephalic and atraumatic.     Right Ear: Tympanic membrane, ear canal and external ear normal. There is no impacted cerumen.     Left Ear: Tympanic membrane, ear canal and external ear normal. There is no impacted cerumen.     Nose: Congestion and rhinorrhea present.  Eyes:     General: No scleral icterus.       Right eye: No discharge.        Left eye: No discharge.     Extraocular Movements: Extraocular movements intact.     Conjunctiva/sclera: Conjunctivae normal.     Pupils: Pupils are equal,  round, and reactive to light.  Neck:     Thyroid: No thyromegaly.  Cardiovascular:     Rate and Rhythm: Normal rate and regular rhythm.     Pulses: Normal pulses.     Heart sounds: Normal heart sounds. No murmur heard. Pulmonary:     Effort: Pulmonary effort is normal. No respiratory distress.     Breath sounds: Normal breath sounds. No wheezing, rhonchi or rales.  Musculoskeletal:     Cervical back: Neck supple.     Right lower leg: No edema.     Left lower leg: No edema.  Lymphadenopathy:     Cervical: No cervical  adenopathy.  Skin:    General: Skin is warm and dry.     Findings: No rash.  Neurological:     Mental Status: She is alert and oriented to person, place, and time. Mental status is at baseline.  Psychiatric:        Mood and Affect: Mood normal.        Behavior: Behavior normal.       Results for orders placed or performed in visit on 04/28/23  POC COVID-19  Result Value Ref Range   SARS Coronavirus 2 Ag Positive (A) Negative    Assessment & Plan    Upper respiratory infection, acute X 1 day   due to COVID-19 - new problem - POC COVID-19 positive - no evidence of strep pharyngitis, CAP, AOM, bacterial sinusitis, or other bacterial infection Pt was advised symptomatic treatment    - discussed return precautions  - discussed masking/isolation   Work note was provided for 10 days of isolation period and VV needs to be scheduled if needed. Advised isolation and testing of all people closed to her  No follow-ups on file.     The patient was advised to call back or seek an in-person evaluation if the symptoms worsen or if the condition fails to improve as anticipated.  I discussed the assessment and treatment plan with the patient. The patient was provided an opportunity to ask questions and all were answered. The patient agreed with the plan and demonstrated an understanding of the instructions.  I, Debera Lat, PA-C have reviewed all documentation for this visit. The documentation on  04/28/23 for the exam, diagnosis, procedures, and orders are all accurate and complete.  Debera Lat, [facility], MMS [facility] [phone] (phone) [phone] (fax)  [facility]

## 2023-05-12 ENCOUNTER — Other Ambulatory Visit: Payer: Self-pay | Admitting: Physician Assistant

## 2023-05-12 DIAGNOSIS — R7303 Prediabetes: Secondary | ICD-10-CM

## 2023-05-12 DIAGNOSIS — I10 Essential (primary) hypertension: Secondary | ICD-10-CM

## 2023-05-14 NOTE — Telephone Encounter (Signed)
Labs in date  Requested Prescriptions  Pending Prescriptions Disp Refills   WEGOVY 2.4 MG/0.75ML SOAJ [Pharmacy Med Name: WEGOVY 2.4MG /0.75ML INJ (4 PENS)] 3 mL 1    Sig: ADMINISTER 2.4 MG UNDER THE SKIN 1 TIME A WEEK     Endocrinology:  Diabetes - GLP-1 Receptor Agonists - semaglutide Failed - 05/12/2023  4:48 PM      Failed - HBA1C in normal range and within 180 days    Hgb A1c MFr Bld  Date Value Ref Range Status  03/31/2023 5.7 (H) 4.8 - 5.6 % Final    Comment:             Prediabetes: 5.7 - 6.4          Diabetes: >6.4          Glycemic control for adults with diabetes: <7.0          Failed - Cr in normal range and within 360 days    Creatinine, Ser  Date Value Ref Range Status  03/31/2023 1.46 (H) 0.57 - 1.00 mg/dL Final         Passed - Valid encounter within last 6 months    Recent Outpatient Visits           2 weeks ago COVID-19   [place] [facility] [place], [place], PA-C   1 month ago Encounter for annual physical exam   [place] [facility], [place], PA-C   2 months ago Leg swelling   [place] [facility] [place], [place], PA-C   6 months ago Class 2 severe obesity due to excess calories with serious comorbidity and body mass index (BMI) of 38.0 to 38.9 in adult [facility])   [place] [facility] [place], [place], PA-C   2 years ago Annual physical exam   [facility] [place], [place], [place]

## 2023-07-01 ENCOUNTER — Other Ambulatory Visit: Payer: Self-pay | Admitting: Physician Assistant

## 2023-07-01 DIAGNOSIS — I10 Essential (primary) hypertension: Secondary | ICD-10-CM

## 2023-07-01 DIAGNOSIS — R7303 Prediabetes: Secondary | ICD-10-CM

## 2023-07-02 NOTE — Telephone Encounter (Signed)
Requested Prescriptions  Pending Prescriptions Disp Refills   Semaglutide-Weight Management (WEGOVY) 2.4 MG/0.75ML SOAJ [Pharmacy Med Name: WEGOVY 2.4MG /0.75ML INJ (4 PENS)] 3 mL 0    Sig: ADMINISTER 2.4 MG UNDER THE SKIN 1 TIME A WEEK     Endocrinology:  Diabetes - GLP-1 Receptor Agonists - semaglutide Failed - 07/01/2023 11:47 AM      Failed - HBA1C in normal range and within 180 days    Hgb A1c MFr Bld  Date Value Ref Range Status  03/31/2023 5.7 (H) 4.8 - 5.6 % Final    Comment:             Prediabetes: 5.7 - 6.4          Diabetes: >6.4          Glycemic control for adults with diabetes: <7.0          Failed - Cr in normal range and within 360 days    Creatinine, Ser  Date Value Ref Range Status  03/31/2023 1.46 (H) 0.57 - 1.00 mg/dL Final         Passed - Valid encounter within last 6 months    Recent Outpatient Visits           2 months ago COVID-19   [place] [facility] [place], [place], PA-C   3 months ago Encounter for annual physical exam   [place] [facility] [place], [place], PA-C   3 months ago Leg swelling   [place] [facility] [place], [place], PA-C   8 months ago Class 2 severe obesity due to excess calories with serious comorbidity and body mass index (BMI) of 38.0 to 38.9 in adult [facility])   [place] [facility] [place], [place], PA-C   2 years ago Annual physical exam   [facility], [place]essandra [place], [place]       Future Appointments             In 4 days [place], [place], PA-C [facility]             telmisartan (MICARDIS) 80 MG tablet [Pharmacy Med Name: TELMISARTAN 80MG  TABLETS] 90 tablet 0    Sig: TAKE 1 TABLET(80 MG) BY MOUTH DAILY     Cardiovascular:  Angiotensin Receptor Blockers Failed - 07/01/2023 11:47 AM      Failed - Cr in normal range and within 180 days    Creatinine, Ser  Date Value Ref Range Status   03/31/2023 1.46 (H) 0.57 - 1.00 mg/dL Final         Passed - K in normal range and within 180 days    Potassium  Date Value Ref Range Status  03/31/2023 4.6 3.5 - 5.2 mmol/L Final         Passed - Patient is not pregnant      Passed - Last BP in normal range    BP Readings from Last 1 Encounters:  04/28/23 95/78         Passed - Valid encounter within last 6 months    Recent Outpatient Visits           2 months ago COVID-19   [facility] [place], [place], PA-C   3 months ago Encounter for annual physical exam   [place] [place], [place], PA-C   3 months ago Leg swelling   [place] [facility] [place], [place], [place]   8  months ago Class 2 severe obesity due to excess calories with serious comorbidity and body mass index (BMI) of 38.0 to 38.9 in adult [facility])   [facility] [place], [place], PA-C   2 years ago Annual physical exam   [facility] [place], [place], [place]       Future Appointments             In 4 days Debera Lat, PA-C [facility]

## 2023-07-06 ENCOUNTER — Encounter: Payer: Self-pay | Admitting: Physician Assistant

## 2023-07-06 ENCOUNTER — Ambulatory Visit (INDEPENDENT_AMBULATORY_CARE_PROVIDER_SITE_OTHER): Payer: Managed Care, Other (non HMO) | Admitting: Physician Assistant

## 2023-07-06 VITALS — BP 116/86 | HR 69 | Ht 67.0 in | Wt 195.5 lb

## 2023-07-06 DIAGNOSIS — M79671 Pain in right foot: Secondary | ICD-10-CM

## 2023-07-06 DIAGNOSIS — R7303 Prediabetes: Secondary | ICD-10-CM

## 2023-07-06 DIAGNOSIS — E669 Obesity, unspecified: Secondary | ICD-10-CM

## 2023-07-06 DIAGNOSIS — Z0000 Encounter for general adult medical examination without abnormal findings: Secondary | ICD-10-CM

## 2023-07-06 DIAGNOSIS — I10 Essential (primary) hypertension: Secondary | ICD-10-CM

## 2023-07-06 DIAGNOSIS — Z6838 Body mass index (BMI) 38.0-38.9, adult: Secondary | ICD-10-CM

## 2023-07-06 MED ORDER — PREDNISONE 20 MG PO TABS: 20.0000 mg | ORAL_TABLET | Freq: Every day | ORAL | 0 refills | Status: DC

## 2023-07-06 MED ORDER — WEGOVY 2.4 MG/0.75ML ~~LOC~~ SOAJ
SUBCUTANEOUS | 5 refills | Status: DC
Start: 2023-07-06 — End: 2023-11-23

## 2023-07-06 MED ORDER — TELMISARTAN 80 MG PO TABS
ORAL_TABLET | ORAL | 1 refills | Status: DC
Start: 2023-07-06 — End: 2023-12-07

## 2023-07-06 MED ORDER — TRIAMTERENE-HCTZ 75-50 MG PO TABS: 1.0000 | ORAL_TABLET | Freq: Every day | ORAL | 1 refills | Status: DC

## 2023-07-06 NOTE — Progress Notes (Signed)
Complete physical exam  Patient: Amanda Ramsey   DOB: 09-20-1976   47 y.o. Female  MRN: [id number] Visit Date: 07/06/2023  Today's healthcare provider: Debera Lat, PA-C   Chief Complaint  Patient presents with   Annual Exam    Wellness check and BMI check, right foot, mid foot is causing pain, would like prenisone    Medication Refill    Wegovy 2.4 Telmasartan    Subjective    Amanda Ramsey is a 47 y.o. female who presents today for a complete physical exam.    Discussed the use of AI scribe software for clinical note transcription with the patient, who gave verbal consent to proceed.  History of Present Illness   The patient, with a history of hypertension, presents with recurrent foot pain, similar to a previous episode. The pain started a few days ago and is located on the top of the foot. The patient reports that the pain is subsiding and is currently manageable with over-the-counter ibuprofen. The patient denies any recent trauma or tight footwear as potential causes.  The patient also mentions a history of polyps and a large one detected during a colonoscopy last year.  The patient is currently on Wegovy, Telmisartan, and Maxzide for hypertension management. The patient also reports experiencing hot flashes and possible hair loss, suggesting possible menopausal symptoms. The patient is due for a tetanus shot and declines the flu vaccine.  The patient also requests a biometric screening for work purposes.        Last depression screening scores    07/06/2023   11:03 AM 03/12/2023    1:29 PM 10/20/2022   10:15 AM  PHQ 2/9 Scores  PHQ - 2 Score 0 0 0  PHQ- 9 Score 1 1 0   Last fall risk screening    04/28/2023   10:54 AM  Fall Risk   Falls in the past year? 0  Number falls in past yr: 0  Injury with Fall? 0   Last Audit-C alcohol use screening    04/28/2023   10:54 AM  Alcohol Use Disorder Test (AUDIT)  1. How often do you have a drink containing  alcohol? 1  2. How many drinks containing alcohol do you have on a typical day when you are drinking? 0  3. How often do you have six or more drinks on one occasion? 0  AUDIT-C Score 1   A score of 3 or more in women, and 4 or more in men indicates increased risk for alcohol abuse, EXCEPT if all of the points are from question 1   Past Medical History:  Diagnosis Date   Anemia    Gallstones    Hypertension    Past Surgical History:  Procedure Laterality Date   ABDOMINAL HYSTERECTOMY  2011   CHOLECYSTECTOMY     Social History   Socioeconomic History   Marital status: Single    Spouse name: Not on file   Number of children: Not on file   Years of education: Not on file   Highest education level: Not on file  Occupational History   Not on file  Tobacco Use   Smoking status: Never   Smokeless tobacco: Never  Vaping Use   Vaping status: Never Used  Substance and Sexual Activity   Alcohol use: Yes    Alcohol/week: 0.0 standard drinks of alcohol    Comment: occasionally   Drug use: No   Sexual activity: Not Currently  Birth control/protection: Surgical  Other Topics Concern   Not on file  Social History Narrative   Not on file   Social Determinants of Health   Financial Resource Strain: Not on file  Food Insecurity: Not on file  Transportation Needs: Not on file  Physical Activity: Not on file  Stress: Not on file  Social Connections: Not on file  Intimate Partner Violence: Not on file   Family Status  Relation Name Status   Mother  Alive   Father  Deceased   Sister  Alive   Neg Hx  (Not Specified)  No partnership data on file   Family History  Problem Relation Age of Onset   Hypertension Mother    Alcohol abuse Father    Cirrhosis Father    Seizures Father    Hypertension Sister    Breast cancer Neg Hx    No Known Allergies  Patient Care Team: Debera Lat, PA-C as PCP - General (Physician Assistant)   Medications: Outpatient Medications  Prior to Visit  Medication Sig   [DISCONTINUED] Semaglutide-Weight Management (WEGOVY) 2.4 MG/0.75ML SOAJ ADMINISTER 2.4 MG UNDER THE SKIN 1 TIME A WEEK   [DISCONTINUED] telmisartan (MICARDIS) 80 MG tablet TAKE 1 TABLET(80 MG) BY MOUTH DAILY   [DISCONTINUED] triamterene-hydrochlorothiazide (MAXZIDE) 75-50 MG tablet Take 1 tablet by mouth daily.   No facility-administered medications prior to visit.    Review of Systems  All other systems reviewed and are negative.  Except see HPI     Objective    BP 116/86 (BP Location: Left Arm, Patient Position: Sitting, Cuff Size: Large)   Pulse 69   Ht 5\' 7"  (1.702 m)   Wt 195 lb 8 oz (88.7 kg)   SpO2 100%   BMI 30.62 kg/m      Physical Exam Vitals reviewed.  Constitutional:      General: She is not in acute distress.    Appearance: Normal appearance. She is well-developed. She is not ill-appearing, toxic-appearing or diaphoretic.  HENT:     Head: Normocephalic and atraumatic.     Right Ear: Tympanic membrane, ear canal and external ear normal.     Left Ear: Tympanic membrane, ear canal and external ear normal.     Nose: Nose normal. No congestion or rhinorrhea.     Mouth/Throat:     Mouth: Mucous membranes are moist.     Pharynx: Oropharynx is clear. No oropharyngeal exudate.  Eyes:     General: No scleral icterus.       Right eye: No discharge.        Left eye: No discharge.     Conjunctiva/sclera: Conjunctivae normal.     Pupils: Pupils are equal, round, and reactive to light.  Neck:     Thyroid: No thyromegaly.     Vascular: No carotid bruit.  Cardiovascular:     Rate and Rhythm: Normal rate and regular rhythm.     Pulses: Normal pulses.     Heart sounds: Normal heart sounds. No murmur heard.    No friction rub. No gallop.  Pulmonary:     Effort: Pulmonary effort is normal. No respiratory distress.     Breath sounds: Normal breath sounds. No stridor. No wheezing, rhonchi or rales.  Chest:     Chest wall: No  tenderness.  Abdominal:     General: Abdomen is flat. Bowel sounds are normal. There is no distension.     Palpations: Abdomen is soft. There is no mass.  Tenderness: There is no abdominal tenderness. There is no right CVA tenderness, left CVA tenderness, guarding or rebound.     Hernia: No hernia is present.  Musculoskeletal:        General: No swelling, tenderness, deformity or signs of injury. Normal range of motion.     Cervical back: Normal range of motion and neck supple. No rigidity or tenderness.     Right lower leg: No edema.     Left lower leg: No edema.  Lymphadenopathy:     Cervical: No cervical adenopathy.  Skin:    General: Skin is warm and dry.     Coloration: Skin is not jaundiced or pale.     Findings: No bruising, erythema, lesion or rash.  Neurological:     Mental Status: She is alert and oriented to person, place, and time. Mental status is at baseline.     Cranial Nerves: No cranial nerve deficit.     Sensory: No sensory deficit.     Motor: No weakness.     Coordination: Coordination normal.     Gait: Gait normal.     Deep Tendon Reflexes: Reflexes normal.  Psychiatric:        Mood and Affect: Mood normal.        Behavior: Behavior normal.        Thought Content: Thought content normal.        Judgment: Judgment normal.      No results found for any visits on 07/06/23.  Assessment & Plan    Routine Health Maintenance and Physical Exam  Exercise Activities and Dietary recommendations  Goals   None     Immunization History  Administered Date(s) Administered   Moderna Sars-Covid-2 Vaccination 03/02/2020, 03/30/2020   Td 03/18/2005   Tdap 09/24/2011    Health Maintenance  Topic Date Due   Colon Cancer Screening  Never done   DTaP/Tdap/Td vaccine (3 - Td or Tdap) 09/23/2021   COVID-19 Vaccine (3 - 2023-24 season) 07/10/2022   Flu Shot  06/10/2023   Pap Smear  01/16/2026   Hepatitis C Screening  Completed   HIV Screening  Completed   HPV  Vaccine  Aged Out    Discussed health benefits of physical activity, and encouraged her to engage in regular exercise appropriate for her age and condition.   Class 2 severe obesity due to excess calories with serious comorbidity and body mass index (BMI) of 38.0 to 38.9 in adult [facility]) Chronic and improved . Current BMI 30+ Successful weight loss with GNFAOZ. No reported side effects. -Continue Wegovy. Refill prescription for 6 months. - Semaglutide-Weight Management (WEGOVY) 2.4 MG/0.75ML SOAJ; ADMINISTER 2.4 MG UNDER THE SKIN 1 TIME A WEEK  Dispense: 3 mL; Refill: 5 Healthy diet and daily exercise encouraged. Initial workup - Comprehensive metabolic panel - Lipid panel - Hemoglobin A1c - CBC with Differential/Platelet - TSH Will reassess after  receiving lab results Will FU  Essential (primary) hypertension Chronic and stable BP today WNL On Telmisartan, prescription expired. -Refill Telmisartan for 6 months. - Semaglutide-Weight Management (WEGOVY) 2.4 MG/0.75ML SOAJ; ADMINISTER 2.4 MG UNDER THE SKIN 1 TIME A WEEK  Dispense: 3 mL; Refill: 5 - telmisartan (MICARDIS) 80 MG tablet; TAKE 1 TABLET(80 MG) BY MOUTH DAILY  Dispense: 90 tablet; Refill: 1 - triamterene-hydrochlorothiazide (MAXZIDE) 75-50 MG tablet; Take 1 tablet by mouth daily.  Dispense: 90 tablet; Refill: 1 Continue low salt diet and daily exercise -Refill Maxzide for 6 months.  Right foot pain Recurrent episodes of  foot pain and swelling, possibly related to overuse or strain. No clear etiology identified. Previous response to prednisone. Discussed risks and benefits of prednisone and NSAIDs. -Prescribe short course of prednisone for use if pain worsens significantly. -Continue ibuprofen as needed for pain, with meals. DG foot showed small calcaneal spurs from 03/12/23 - predniSONE (DELTASONE) 20 MG tablet; Take 1 tablet (20 mg total) by mouth daily with breakfast.  Dispense: 5 tablet; Refill:  0 Advised to use only  if OTC pain medications are not helpful Advised wide based shoed, rest, ice, raise.   Encounter for annual physical exam UTD on dental/eye Things to do to keep yourself healthy  - Exercise at least 30-45 minutes a day, 3-4 days a week.  - Eat a low-fat diet with lots of fruits and vegetables, up to 7-9 servings per day.  - Seatbelts can save your life. Wear them always.  - Smoke detectors on every level of your home, check batteries every year.  - Eye Doctor - have an eye exam every 1-2 years  - Safe sex - if you may be exposed to STDs, use a condom.  - Alcohol -  If you drink, do it moderately, less than 2 drinks per day.  - Health Care Power of Attorney. Choose someone to speak for you if you are not able.  - Depression is common in our stressful world.If you're feeling down or losing interest in things you normally enjoy, please come in for a visit.  - Violence - If anyone is threatening or hurting you, please call immediately.  General Health Maintenance -Order lipid panel and CMP. -Check TSH due to slight cold intolerance and hair loss. -Advise patient to continue self breast exams. -Advise patient to bring in colonoscopy results from last year. -Advise patient to schedule eye exam next year. -Advise patient to schedule Pap smear in 2027. -Advise patient to get tetanus shot after returning from vacation.      Return in about 3 months (around 10/06/2023) for chronic disease f/u.    The patient was advised to call back or seek an in-person evaluation if the symptoms worsen or if the condition fails to improve as anticipated.  I discussed the assessment and treatment plan with the patient. The patient was provided an opportunity to ask questions and all were answered. The patient agreed with the plan and demonstrated an understanding of the instructions.  I, Debera Lat, PA-C have reviewed all documentation for this visit. The documentation on  07/06/23 for the exam, diagnosis,  procedures, and orders are all accurate and complete.  Debera Lat, [facility], MMS [facility] [phone] (phone) [phone] (fax)  [facility]

## 2023-07-07 LAB — HEMOGLOBIN A1C
Est. average glucose Bld gHb Est-mCnc: 100 mg/dL
Hgb A1c MFr Bld: 5.1 % (ref 4.8–5.6)

## 2023-07-07 LAB — COMPREHENSIVE METABOLIC PANEL
ALT: 17 IU/L (ref 0–32)
AST: 11 IU/L (ref 0–40)
Albumin: 4.2 g/dL (ref 3.9–4.9)
Alkaline Phosphatase: 34 IU/L — ABNORMAL LOW (ref 44–121)
BUN/Creatinine Ratio: 13 (ref 9–23)
BUN: 14 mg/dL (ref 6–24)
Bilirubin Total: 0.5 mg/dL (ref 0.0–1.2)
CO2: 25 mmol/L (ref 20–29)
Calcium: 9.6 mg/dL (ref 8.7–10.2)
Chloride: 106 mmol/L (ref 96–106)
Creatinine, Ser: 1.07 mg/dL — ABNORMAL HIGH (ref 0.57–1.00)
Globulin, Total: 3.4 g/dL (ref 1.5–4.5)
Glucose: 81 mg/dL (ref 70–99)
Potassium: 3.7 mmol/L (ref 3.5–5.2)
Sodium: 143 mmol/L (ref 134–144)
Total Protein: 7.6 g/dL (ref 6.0–8.5)
eGFR: 64 mL/min/{1.73_m2} (ref >59)

## 2023-07-07 LAB — LIPID PANEL
Chol/HDL Ratio: 3.8 ratio (ref 0.0–4.4)
Cholesterol, Total: 204 mg/dL — ABNORMAL HIGH (ref 100–199)
HDL: 54 mg/dL (ref >39)
LDL Chol Calc (NIH): 140 mg/dL — ABNORMAL HIGH (ref 0–99)
Triglycerides: 57 mg/dL (ref 0–149)
VLDL Cholesterol Cal: 10 mg/dL (ref 5–40)

## 2023-07-07 LAB — CBC WITH DIFFERENTIAL/PLATELET
Basophils Absolute: 0 10*3/uL (ref 0.0–0.2)
Basos: 1 %
EOS (ABSOLUTE): 0.1 10*3/uL (ref 0.0–0.4)
Eos: 2 %
Hematocrit: 36.3 % (ref 34.0–46.6)
Hemoglobin: 11.4 g/dL (ref 11.1–15.9)
Immature Grans (Abs): 0 10*3/uL (ref 0.0–0.1)
Immature Granulocytes: 0 %
Lymphocytes Absolute: 1.9 10*3/uL (ref 0.7–3.1)
Lymphs: 38 %
MCH: 25.6 pg — ABNORMAL LOW (ref 26.6–33.0)
MCHC: 31.4 g/dL — ABNORMAL LOW (ref 31.5–35.7)
MCV: 82 fL (ref 79–97)
Monocytes Absolute: 0.3 10*3/uL (ref 0.1–0.9)
Monocytes: 6 %
Neutrophils Absolute: 2.7 10*3/uL (ref 1.4–7.0)
Neutrophils: 53 %
Platelets: 318 10*3/uL (ref 150–450)
RBC: 4.45 x10E6/uL (ref 3.77–5.28)
RDW: 13.4 % (ref 11.7–15.4)
WBC: 5 10*3/uL (ref 3.4–10.8)

## 2023-07-07 LAB — TSH: TSH: 2.97 u[IU]/mL (ref 0.450–4.500)

## 2023-07-07 NOTE — Progress Notes (Signed)
Hello Amanda Ramsey ,   Your labwork results all are within normal limits or stable for you .your kidney function improved since 8 mo ago..   Any questions please reach out to the office or message me on MyChart!  Best, Debera Lat, PA-C

## 2023-07-27 ENCOUNTER — Other Ambulatory Visit (HOSPITAL_COMMUNITY)
Admission: RE | Admit: 2023-07-27 | Discharge: 2023-07-27 | Disposition: A | Discharge status: Home / Self Care | Payer: Managed Care, Other (non HMO) | Source: Ambulatory Visit | Attending: Physician Assistant | Admitting: Physician Assistant

## 2023-07-27 ENCOUNTER — Ambulatory Visit (INDEPENDENT_AMBULATORY_CARE_PROVIDER_SITE_OTHER): Payer: Managed Care, Other (non HMO) | Admitting: Physician Assistant

## 2023-07-27 VITALS — BP 118/81 | HR 87 | Temp 98.3°F | Resp 16 | Wt 195.0 lb

## 2023-07-27 DIAGNOSIS — R350 Frequency of micturition: Secondary | ICD-10-CM | POA: Diagnosis present

## 2023-07-27 DIAGNOSIS — R300 Dysuria: Secondary | ICD-10-CM

## 2023-07-27 LAB — POCT URINALYSIS DIPSTICK
Bilirubin, UA: NEGATIVE
Glucose, UA: NEGATIVE
Ketones, UA: NEGATIVE
Nitrite, UA: POSITIVE
Protein, UA: POSITIVE
Spec Grav, UA: 1.015 (ref 1.010–1.025)
Urobilinogen, UA: 0.2 U/dL
pH, UA: 5 (ref 5.0–8.0)

## 2023-07-27 MED ORDER — NITROFURANTOIN MONOHYD MACRO 100 MG PO CAPS
100.0000 mg | ORAL_CAPSULE | Freq: Two times a day (BID) | ORAL | 0 refills | Status: DC
Start: 2023-07-27 — End: 2023-08-02

## 2023-07-27 NOTE — Progress Notes (Unsigned)
Established patient visit  Patient: Amanda Ramsey   DOB: 03/25/1976   47 y.o. Female  MRN: [id number] Visit Date: 07/27/2023  Today's healthcare provider: Debera Lat, PA-C   Chief Complaint  Patient presents with   Urinary Tract Infection   Subjective    Urinary Tract Infection    HPI     Urinary Tract Infection   Recent episode started in the past 7 days.  Abdominal Pain: Absent.  Back Pain: Absent.  Chills: Absent.  Cloudy malodorus urine: Present.  Constipation: Absent.  Cramping: Present.  Diarrhea: Absent.  Discharge: Absent.  Fever: Absent.  Hematuria: Present.  Nausea: Absent.  Vomiting: Absent.      Last edited by Adline Peals, CMA on 07/27/2023  1:35 PM.      *** Discussed the use of AI scribe software for clinical note transcription with the patient, who gave verbal consent to proceed.  History of Present Illness               07/06/2023   11:03 AM 03/12/2023    1:29 PM 10/20/2022   10:15 AM  Depression screen PHQ 2/9  Decreased Interest 0 0 0  Down, Depressed, Hopeless 0 0 0  PHQ - 2 Score 0 0 0  Altered sleeping 1 1 0  Tired, decreased energy 0 0 0  Change in appetite 0 0 0  Feeling bad or failure about yourself  0 0 0  Trouble concentrating 0 0 0  Moving slowly or fidgety/restless 0 0 0  Suicidal thoughts 0 0 0  PHQ-9 Score 1 1 0  Difficult doing work/chores  Not difficult at all Not difficult at all      07/06/2023   11:03 AM  GAD 7 : Generalized Anxiety Score  Nervous, Anxious, on Edge 0  Control/stop worrying 0  Worry too much - different things 0  Trouble relaxing 0  Restless 0  Easily annoyed or irritable 0  Afraid - awful might happen 0  Total GAD 7 Score 0    Medications: Outpatient Medications Prior to Visit  Medication Sig   predniSONE (DELTASONE) 20 MG tablet Take 1 tablet (20 mg total) by mouth daily with breakfast.   Semaglutide-Weight Management (WEGOVY) 2.4 MG/0.75ML SOAJ ADMINISTER 2.4 MG UNDER THE SKIN 1 TIME A  WEEK   telmisartan (MICARDIS) 80 MG tablet TAKE 1 TABLET(80 MG) BY MOUTH DAILY   triamterene-hydrochlorothiazide (MAXZIDE) 75-50 MG tablet Take 1 tablet by mouth daily.   No facility-administered medications prior to visit.    Review of Systems  All other systems reviewed and are negative.  Except see HPI   {Insert previous labs (optional):23779} {See past labs  Heme  Chem  Endocrine  Serology  Results Review (optional):1}   Objective    BP 118/81 (BP Location: Right Arm, Patient Position: Sitting, Cuff Size: Normal)   Pulse 87   Temp 98.3 F (36.8 C) (Oral)   Resp 16   Wt 195 lb (88.5 kg)   BMI 30.54 kg/m  {Insert last BP/Wt (optional):23777}{See vitals history (optional):1}   Physical Exam Vitals reviewed.  Constitutional:      General: She is not in acute distress.    Appearance: She is well-developed.  HENT:     Head: Normocephalic and atraumatic.  Eyes:     General: No scleral icterus.    Conjunctiva/sclera: Conjunctivae normal.  Cardiovascular:     Rate and Rhythm: Normal rate and regular rhythm.     Heart sounds: Normal heart  sounds. No murmur heard. Pulmonary:     Effort: Pulmonary effort is normal. No respiratory distress.     Breath sounds: Normal breath sounds. No wheezing or rales.  Abdominal:     General: There is no distension.     Palpations: Abdomen is soft.     Tenderness: There is abdominal tenderness in the suprapubic area. There is no guarding or rebound.  Skin:    General: Skin is warm and dry.     Capillary Refill: Capillary refill takes less than 2 seconds.     Findings: No rash.  Neurological:     Mental Status: She is alert and oriented to person, place, and time.  Psychiatric:        Behavior: Behavior normal.      No results found for any visits on 07/27/23.  Assessment & Plan    *** Assessment and Plan              No follow-ups on file.     The patient was advised to call back or seek an in-person evaluation if  the symptoms worsen or if the condition fails to improve as anticipated.  I discussed the assessment and treatment plan with the patient. The patient was provided an opportunity to ask questions and all were answered. The patient agreed with the plan and demonstrated an understanding of the instructions.  I, Debera Lat, PA-C have reviewed all documentation for this visit. The documentation on  07/27/23 for the exam, diagnosis, procedures, and orders are all accurate and complete.  Debera Lat, [facility], MMS [facility] [phone] (phone) [phone] (fax)  [facility]

## 2023-07-28 ENCOUNTER — Encounter: Payer: Self-pay | Admitting: Physician Assistant

## 2023-07-29 ENCOUNTER — Other Ambulatory Visit: Payer: Self-pay | Admitting: Physician Assistant

## 2023-07-29 DIAGNOSIS — N760 Acute vaginitis: Secondary | ICD-10-CM

## 2023-07-29 LAB — CERVICOVAGINAL ANCILLARY ONLY
Bacterial Vaginitis (gardnerella): POSITIVE — AB
Candida Glabrata: NEGATIVE
Candida Vaginitis: NEGATIVE
Chlamydia: NEGATIVE
Comment: NEGATIVE
Comment: NEGATIVE
Comment: NEGATIVE
Comment: NEGATIVE
Comment: NEGATIVE
Comment: NORMAL
Neisseria Gonorrhea: NEGATIVE
Trichomonas: NEGATIVE

## 2023-07-29 MED ORDER — METRONIDAZOLE 500 MG PO TABS
500.0000 mg | ORAL_TABLET | Freq: Two times a day (BID) | ORAL | 0 refills | Status: AC
Start: 2023-07-29 — End: 2023-08-05

## 2023-07-29 NOTE — Progress Notes (Signed)
Please, pt that metronidazole will be called in to her pharmacy, she has bacterial vaginitis. Urine culture pending.

## 2023-07-30 ENCOUNTER — Telehealth: Payer: Self-pay

## 2023-07-30 NOTE — Telephone Encounter (Signed)
Pt called Amanda Ramsey. She had gotten a call from her pharmacy that an Rx was ready for p/u.  Shared provider's note regarding lab results.  Pt stated  at recent OV that provider diagnosed UTI and started her on an ABX. Per pt, provider also stated that if testing showed a vaginal infection that pt will need to be switched to a new ABX.   Should pt be taking both ABX at the same time.  Please advise pt asap.  Debera Lat, PA-C 07/29/2023  4:51 PM EDT Back to Top    Please, pt that metronidazole will be called in to her pharmacy, she has bacterial vaginitis. Urine culture pending.

## 2023-07-30 NOTE — Telephone Encounter (Signed)
Patient advised. Verbalized understanding

## 2023-08-01 LAB — URINE CULTURE

## 2023-08-02 ENCOUNTER — Ambulatory Visit: Payer: Self-pay | Admitting: *Deleted

## 2023-08-02 ENCOUNTER — Other Ambulatory Visit: Payer: Self-pay | Admitting: Physician Assistant

## 2023-08-02 DIAGNOSIS — N3000 Acute cystitis without hematuria: Secondary | ICD-10-CM

## 2023-08-02 DIAGNOSIS — N3001 Acute cystitis with hematuria: Secondary | ICD-10-CM

## 2023-08-02 MED ORDER — CEPHALEXIN 500 MG PO CAPS
500.0000 mg | ORAL_CAPSULE | Freq: Two times a day (BID) | ORAL | 0 refills | Status: DC
Start: 2023-08-02 — End: 2024-01-15

## 2023-08-02 NOTE — Progress Notes (Signed)
Please, stop taking nitrofurantoin and start taking cephalexin. Med is called in to your pharmacy. If you will suspect yeast infection, please, use monistat for 7 days.

## 2023-08-02 NOTE — Telephone Encounter (Signed)
Summary: cephALEXin (KEFLEX) 500 MG capsule ?   The patient called in wanting to speak with her provider about the medication, cephALEXin (KEFLEX) 500 MG capsule that was prescribed. She states no one has called her or gone over why she is getting these medications. She states she is on 2 other medications as well and doesn't know if she needs to be taking all 3 or stop any of the other ones. Please assist patient further.         Attempted to call patient- no answer- left message to call office- (see lab notes for clarification on medications)

## 2023-08-02 NOTE — Telephone Encounter (Signed)
   Chief Complaint: New medication alert-Patient state sshe has new medication- please call her  Disposition: [] ED /[] Urgent Care (no appt availability in office) / [] Appointment(In office/virtual)/ []  [place] [facility]/ [x] Home Care/ [] Refused Recommended Disposition /[] [place] [facility]/ []  Follow-up with PCP Additional Notes: Patient has questions about new medication: Per lab notes: Please, stop taking nitrofurantoin and start taking cephalexin. Med is called in to your pharmacy. If you will suspect yeast infection, please, use monistat for 7 days.  (Patient will continue her metronidazole also)  Reason for Disposition . Caller has medicine question only, adult not sick, AND triager answers question  Answer Assessment - Initial Assessment Questions 1. NAME of MEDICINE: "What medicine(s) are you calling about?"     Patient requesting clarity on medications 2. QUESTION: "What is your question?" (e.g., double dose of medicine, side effect)     She just received notification of another medication called in- please call her 3. PRESCRIBER: "Who prescribed the medicine?" Reason: if prescribed by specialist, call should be referred to that group.     PCP Patient notified urine culture came back-  Please, stop taking nitrofurantoin and start taking cephalexin. Med is called in to your pharmacy. If you will suspect yeast infection, please, use monistat for 7 days. Patient states she understands.  Protocols used: Medication Question Call-A-AH

## 2023-08-05 ENCOUNTER — Other Ambulatory Visit: Payer: Self-pay | Admitting: Physician Assistant

## 2023-08-05 DIAGNOSIS — N760 Acute vaginitis: Secondary | ICD-10-CM

## 2023-08-05 MED ORDER — METRONIDAZOLE 0.75 % VA GEL
VAGINAL | 0 refills | Status: DC
Start: 2023-08-05 — End: 2024-01-15

## 2023-09-16 ENCOUNTER — Other Ambulatory Visit: Payer: Self-pay | Admitting: Physician Assistant

## 2023-09-16 DIAGNOSIS — R300 Dysuria: Secondary | ICD-10-CM

## 2023-09-16 DIAGNOSIS — R350 Frequency of micturition: Secondary | ICD-10-CM

## 2023-09-16 NOTE — Telephone Encounter (Signed)
Unable to refill per protocol, Rx expired. Discontinued 08/02/23, change in therapy.  Requested Prescriptions  Pending Prescriptions Disp Refills   nitrofurantoin, macrocrystal-monohydrate, (MACROBID) 100 MG capsule [Pharmacy Med Name: NITROFURANTOIN MONO/MAC 100MG  CAPS] 14 capsule 0    Sig: TAKE 1 CAPSULE(100 MG) BY MOUTH TWICE DAILY     Off-Protocol Failed - 09/16/2023  9:03 AM      Failed - Medication not assigned to a protocol, review manually.      Passed - Valid encounter within last 12 months    Recent Outpatient Visits           1 month ago Urinary frequency   [place] [facility] [place], [place], PA-C   2 months ago Encounter for annual physical exam   [place] [facility] [place], [place], PA-C   4 months ago COVID-19   [facility], [place], PA-C   5 months ago Encounter for annual physical exam   [facility] [place], [place], PA-C   6 months ago Leg swelling   [place] [facility], [place], [place]

## 2023-10-25 ENCOUNTER — Telehealth: Payer: Self-pay | Admitting: Physician Assistant

## 2023-10-25 NOTE — Telephone Encounter (Signed)
Received fax from [facility] for prior auth. For wegovy 2.4mg /0.75 ml.   Call plan at [phone] ID recipient ID is [id number]

## 2023-10-26 NOTE — Telephone Encounter (Signed)
Copied from CRM [id number]. Topic: General - Other >> Oct 26, 2023 10:17 AM Macon Large wrote: Reason for CRM: Pt stated that the Semaglutide-Weight Management [facility]) needs prior authorization

## 2023-11-01 ENCOUNTER — Telehealth: Payer: Self-pay | Admitting: Physician Assistant

## 2023-11-01 NOTE — Telephone Encounter (Signed)
2nd request sent for prior auth. For Wegovy 2.4 mg/0.29ml  [phone]  ID # [id number]

## 2023-11-04 NOTE — Telephone Encounter (Signed)
Recieved a fax from covermymeds for Wegovy 2.4MG /0.75ML Auto-injectors has been started for patient.   key: [id number]

## 2023-11-08 ENCOUNTER — Other Ambulatory Visit: Payer: Self-pay

## 2023-11-08 ENCOUNTER — Telehealth: Payer: Self-pay | Admitting: Physician Assistant

## 2023-11-08 DIAGNOSIS — I10 Essential (primary) hypertension: Secondary | ICD-10-CM

## 2023-11-08 DIAGNOSIS — E66812 Obesity, class 2: Secondary | ICD-10-CM

## 2023-11-08 DIAGNOSIS — R7303 Prediabetes: Secondary | ICD-10-CM

## 2023-11-08 NOTE — Telephone Encounter (Signed)
 [facility] pharmacy is requesting prescription refill Semaglutide-Weight Management (WEGOVY) 2.4 MG/0.75ML SOAJ  Please advise

## 2023-11-11 ENCOUNTER — Ambulatory Visit: Payer: Self-pay | Admitting: *Deleted

## 2023-11-11 NOTE — Telephone Encounter (Signed)
  Chief Complaint: medication not being refilled without prior authorization . Has been getting medication since August. Wecovy 2.4 mg Symptoms: has not taken medication x 2 weeks  Frequency: 2 weeks  Pertinent Negatives: Patient denies na Disposition: [] ED /[] Urgent Care (no appt availability in office) / [] Appointment(In office/virtual)/ []  [place] [facility]/ [] Home Care/ [] Refused Recommended Disposition /[] [place] Mobile [facility]/ [x]  Follow-up with PCP Additional Notes:   Please advise why patient can not get wecovy medication refilled . She has been out of medication x 2 weeks and reports she does not understand why she needs prior authorization for a medication she has been taking . Patient requesting a call back .       Reason for Disposition  [1] Caller has NON-URGENT medicine question about med that PCP prescribed AND [2] triager unable to answer question  Answer Assessment - Initial Assessment Questions 1. NAME of MEDICINE: What medicine(s) are you calling about?     wecovy 2. QUESTION: What is your question? (e.g., double dose of medicine, side effect)     Why medication can not be refilled? Why continues to need prior authorization 3. PRESCRIBER: Who prescribed the medicine? Reason: if prescribed by specialist, call should be referred to that group.     DOROTHA Spencer, PA 4. SYMPTOMS: Do you have any symptoms? If Yes, ask: What symptoms are you having?  How bad are the symptoms (e.g., mild, moderate, severe)     Out of medication x 2 weeks  5. PREGNANCY:  Is there any chance that you are pregnant? When was your last menstrual period?     na  Protocols used: Medication Question Call-A-AH

## 2023-11-16 NOTE — Telephone Encounter (Signed)
 Duplicate encounter. Will continue in original encounter.

## 2023-11-18 NOTE — Telephone Encounter (Signed)
 Duplicate encounter. Will continue in original encounter.

## 2023-11-22 NOTE — Telephone Encounter (Signed)
 Received a fax from covermymeds for Wegovy  Key:  [id number]  This looks like the only encounter with a covermymeds code.

## 2023-11-23 ENCOUNTER — Telehealth: Payer: Self-pay | Admitting: Physician Assistant

## 2023-11-23 DIAGNOSIS — I10 Essential (primary) hypertension: Secondary | ICD-10-CM

## 2023-11-23 DIAGNOSIS — E66812 Obesity, class 2: Secondary | ICD-10-CM

## 2023-11-23 DIAGNOSIS — R7303 Prediabetes: Secondary | ICD-10-CM

## 2023-11-23 MED ORDER — WEGOVY 2.4 MG/0.75ML ~~LOC~~ SOAJ
SUBCUTANEOUS | 5 refills | Status: DC
Start: 2023-11-23 — End: 2024-07-06

## 2023-11-23 NOTE — Telephone Encounter (Signed)
**Note De-identified  [place] Obfuscation** Please advise 

## 2023-11-23 NOTE — Telephone Encounter (Signed)
 PA started--[id number] ZOXWRU 2.4MG /0.75ML auto-injectors--waiting for approval.

## 2023-11-23 NOTE — Telephone Encounter (Signed)
 our prior authorization for Wegovy  has been approved! More Info Personalized support and financial assistance may be available through the [employer] program. For more information, and to see program requirements, click on the More Info button to the right.  Message from plan: Request Reference Number: [id number]. WEGOVY  INJ 2.4MG  is approved through 05/22/2024. Your patient may now fill this prescription and it will be covered.. Authorization Expiration Date: May 22, 2024  Called the pharmacy and aware of approval.

## 2023-11-23 NOTE — Telephone Encounter (Signed)
 [facility] pharmacy is requesting prescription refill Semaglutide-Weight Management (WEGOVY) 2.4 MG/0.75ML SOAJ  Please advise

## 2023-11-24 NOTE — Telephone Encounter (Signed)
 Amanda Ramsey (Key: [id number]) - [id number] Wegovy  2.4MG /0.75ML auto-injectors status: PA Response - Approved

## 2023-12-05 ENCOUNTER — Other Ambulatory Visit: Payer: Self-pay | Admitting: Physician Assistant

## 2023-12-05 DIAGNOSIS — I10 Essential (primary) hypertension: Secondary | ICD-10-CM

## 2023-12-06 ENCOUNTER — Other Ambulatory Visit: Payer: Self-pay | Admitting: Physician Assistant

## 2023-12-06 DIAGNOSIS — Z1231 Encounter for screening mammogram for malignant neoplasm of breast: Secondary | ICD-10-CM

## 2023-12-07 NOTE — Telephone Encounter (Signed)
Patient will need an office visit for additional refills. Requested Prescriptions  Pending Prescriptions Disp Refills   telmisartan (MICARDIS) 80 MG tablet [Pharmacy Med Name: TELMISARTAN 80MG  TABLETS] 90 tablet 0    Sig: TAKE 1 TABLET(80 MG) BY MOUTH DAILY     Cardiovascular:  Angiotensin Receptor Blockers Failed - 12/07/2023  3:23 PM      Failed - Cr in normal range and within 180 days    Creatinine, Ser  Date Value Ref Range Status  07/06/2023 1.07 (H) 0.57 - 1.00 mg/dL Final         Passed - K in normal range and within 180 days    Potassium  Date Value Ref Range Status  07/06/2023 3.7 3.5 - 5.2 mmol/L Final         Passed - Patient is not pregnant      Passed - Last BP in normal range    BP Readings from Last 1 Encounters:  07/27/23 118/81         Passed - Valid encounter within last 6 months    Recent Outpatient Visits           4 months ago Urinary frequency   [facility] [place], [place], PA-C   5 months ago Encounter for annual physical exam   [facility] [place], [place], PA-C   7 months ago COVID-19   [facility] [place], [place], PA-C   8 months ago Encounter for annual physical exam   [facility] [place], [place], PA-C   9 months ago Leg swelling   [facility] [place], [place], [place]

## 2024-01-06 ENCOUNTER — Ambulatory Visit
Admission: RE | Admit: 2024-01-06 | Discharge: 2024-01-06 | Disposition: A | Discharge status: Home / Self Care | Payer: Managed Care, Other (non HMO) | Source: Ambulatory Visit | Attending: Physician Assistant | Admitting: Physician Assistant

## 2024-01-06 DIAGNOSIS — Z1231 Encounter for screening mammogram for malignant neoplasm of breast: Secondary | ICD-10-CM | POA: Diagnosis present

## 2024-01-11 NOTE — Progress Notes (Signed)
 Negative mammogram , screening in a year advised

## 2024-01-14 ENCOUNTER — Ambulatory Visit: Payer: Self-pay | Admitting: Physician Assistant

## 2024-01-14 ENCOUNTER — Telehealth: Payer: Self-pay

## 2024-01-14 ENCOUNTER — Encounter: Payer: Self-pay | Admitting: Physician Assistant

## 2024-01-14 VITALS — BP 107/76 | HR 79 | Temp 98.1°F | Resp 16 | Ht 67.0 in | Wt 211.0 lb

## 2024-01-14 DIAGNOSIS — Z6838 Body mass index (BMI) 38.0-38.9, adult: Secondary | ICD-10-CM

## 2024-01-14 DIAGNOSIS — I10 Essential (primary) hypertension: Secondary | ICD-10-CM

## 2024-01-14 DIAGNOSIS — E66812 Obesity, class 2: Secondary | ICD-10-CM | POA: Diagnosis not present

## 2024-01-14 DIAGNOSIS — Z8639 Personal history of other endocrine, nutritional and metabolic disease: Secondary | ICD-10-CM | POA: Diagnosis not present

## 2024-01-14 NOTE — Progress Notes (Signed)
 Established patient visit  Patient: Amanda Ramsey   DOB: 1976/08/10   48 y.o. Female  MRN: [id number] Visit Date: 01/14/2024  Today's healthcare provider: Debera Lat, PA-C   Chief Complaint  Patient presents with   Follow-up    F/u on meds   Subjective     Discussed the use of AI scribe software for clinical note transcription with the patient, who gave verbal consent to proceed.  History of Present Illness   The patient, with a history of hypertension, vitamin D deficiency, and hysterectomy, presents for a medication follow-up visit. The patient is currently on Wegovy for weight loss and reports gaining weight due to lack of exercise. The patient is also on Maxzide and Micardis for blood pressure control and reports no issues with these medications. The patient has a history of fluctuating kidney function and is not currently experiencing any symptoms related to this. The patient has a history of vitamin D deficiency but is not currently on supplementation. The patient has a history of hysterectomy and is not on any hormone replacement therapy. The patient reports regular bowel movements and no nausea or vomiting. The patient denies any chest pain, shortness of breath, or rapid heart beating.           01/14/2024    2:37 PM 07/06/2023   11:03 AM 03/12/2023    1:29 PM  Depression screen PHQ 2/9  Decreased Interest 0 0 0  Down, Depressed, Hopeless 0 0 0  PHQ - 2 Score 0 0 0  Altered sleeping 1 1 1   Tired, decreased energy 0 0 0  Change in appetite 0 0 0  Feeling bad or failure about yourself  0 0 0  Trouble concentrating 0 0 0  Moving slowly or fidgety/restless 0 0 0  Suicidal thoughts 0 0 0  PHQ-9 Score 1 1 1   Difficult doing work/chores Not difficult at all  Not difficult at all      01/14/2024    2:37 PM 07/06/2023   11:03 AM  GAD 7 : Generalized Anxiety Score  Nervous, Anxious, on Edge 0 0  Control/stop worrying 1 0  Worry too much - different things 1 0  Trouble  relaxing 0 0  Restless 0 0  Easily annoyed or irritable 0 0  Afraid - awful might happen 0 0  Total GAD 7 Score 2 0  Anxiety Difficulty Not difficult at all     Medications: Outpatient Medications Prior to Visit  Medication Sig   Semaglutide-Weight Management (WEGOVY) 2.4 MG/0.75ML SOAJ ADMINISTER 2.4 MG UNDER THE SKIN 1 TIME A WEEK   telmisartan (MICARDIS) 80 MG tablet TAKE 1 TABLET(80 MG) BY MOUTH DAILY   [DISCONTINUED] triamterene-hydrochlorothiazide (MAXZIDE) 75-50 MG tablet Take 1 tablet by mouth daily.   [DISCONTINUED] cephALEXin (KEFLEX) 500 MG capsule Take 1 capsule (500 mg total) by mouth 2 (two) times daily. (Patient not taking: Reported on 01/14/2024)   [DISCONTINUED] metroNIDAZOLE (METROGEL) 0.75 % vaginal gel 1 full applicator (5g) vaginally daily for 5 days (Patient not taking: Reported on 01/14/2024)   [DISCONTINUED] predniSONE (DELTASONE) 20 MG tablet Take 1 tablet (20 mg total) by mouth daily with breakfast. (Patient not taking: Reported on 01/14/2024)   No facility-administered medications prior to visit.    Review of Systems All negative Except see HPI       Objective    BP 107/76 (BP Location: Right Arm, Patient Position: Sitting, Cuff Size: Large)   Pulse 79   Temp 98.1 F (36.7  C) (Oral)   Resp 16   Ht 5\' 7"  (1.702 m)   Wt 211 lb (95.7 kg)   SpO2 99%   BMI 33.05 kg/m     Physical Exam Vitals reviewed.  Constitutional:      General: She is not in acute distress.    Appearance: Normal appearance. She is well-developed. She is not diaphoretic.  HENT:     Head: Normocephalic and atraumatic.  Eyes:     General: No scleral icterus.    Conjunctiva/sclera: Conjunctivae normal.  Neck:     Thyroid: No thyromegaly.  Cardiovascular:     Rate and Rhythm: Normal rate and regular rhythm.     Pulses: Normal pulses.     Heart sounds: Normal heart sounds. No murmur heard. Pulmonary:     Effort: Pulmonary effort is normal. No respiratory distress.     Breath  sounds: Normal breath sounds. No wheezing, rhonchi or rales.  Musculoskeletal:     Cervical back: Neck supple.     Right lower leg: No edema.     Left lower leg: No edema.  Lymphadenopathy:     Cervical: No cervical adenopathy.  Skin:    General: Skin is warm and dry.     Findings: No rash.  Neurological:     Mental Status: She is alert and oriented to person, place, and time. Mental status is at baseline.  Psychiatric:        Mood and Affect: Mood normal.        Behavior: Behavior normal.      Results for orders placed or performed in visit on 01/14/24  TSH  Result Value Ref Range   TSH 3.240 0.450 - 4.500 uIU/mL  Hemoglobin A1c  Result Value Ref Range   Hgb A1c MFr Bld 5.4 4.8 - 5.6 %   Est. average glucose Bld gHb Est-mCnc 108 mg/dL  Lipid panel  Result Value Ref Range   Cholesterol, Total 252 (H) 100 - 199 mg/dL   Triglycerides 55 0 - 149 mg/dL   HDL 70 >16 mg/dL   VLDL Cholesterol Cal 9 5 - 40 mg/dL   LDL Chol Calc (NIH) 109 (H) 0 - 99 mg/dL   Chol/HDL Ratio 3.6 0.0 - 4.4 ratio  CBC with Differential/Platelet  Result Value Ref Range   WBC 6.0 3.4 - 10.8 x10E3/uL   RBC 5.01 3.77 - 5.28 x10E6/uL   Hemoglobin 12.8 11.1 - 15.9 g/dL   Hematocrit 60.4 54.0 - 46.6 %   MCV 80 79 - 97 fL   MCH 25.5 (L) 26.6 - 33.0 pg   MCHC 31.9 31.5 - 35.7 g/dL   RDW 98.1 19.1 - 47.8 %   Platelets 367 150 - 450 x10E3/uL   Neutrophils 46 Not Estab. %   Lymphs 46 Not Estab. %   Monocytes 5 Not Estab. %   Eos 2 Not Estab. %   Basos 1 Not Estab. %   Neutrophils Absolute 2.7 1.4 - 7.0 x10E3/uL   Lymphocytes Absolute 2.8 0.7 - 3.1 x10E3/uL   Monocytes Absolute 0.3 0.1 - 0.9 x10E3/uL   EOS (ABSOLUTE) 0.1 0.0 - 0.4 x10E3/uL   Basophils Absolute 0.0 0.0 - 0.2 x10E3/uL   Immature Granulocytes 0 Not Estab. %   Immature Grans (Abs) 0.0 0.0 - 0.1 x10E3/uL  VITAMIN D 25 Hydroxy (Vit-D Deficiency, Fractures)  Result Value Ref Range   Vit D, 25-Hydroxy 42.0 30.0 - 100.0 ng/mL         Assessment and Plan  Hypertension chronic Well controlled on current regimen of Maxzide 75-50 and Micardis 80. No reported side effects. -Continue Maxzide and Micardis, low sodium diet, exercise -Refill Maxzide for six months.  Micardis should be refill in April.  will fu  Obesity/Weight Management Patient reports lack of exercise and weight gain. Currently on Wegovy for weight loss. -Encourage diet and exercise. -Continue Wegovy 2.4.  Reginal Lutes should be refilled in May will fu  General Health Maintenance Last complete physical exam was six months ago. Last blood work was in August. No reported chest pain, shortness of breath, or rapid heart beating. No reported nausea, vomiting, or bowel irregularities. No reported swelling. -Order blood work to check hemoglobin level, A1C, lipids, creatinine, and vitamin D level. -Consider need for calcium and vitamin D supplementation based on lab results. -Advise patient to monitor for any lumps in the neck or upper left side pain. -Follow-up in six months or sooner if any issues arise.      Orders Placed This Encounter  Procedures   TSH   Hemoglobin A1c   Lipid panel    Has the patient fasted?:   Yes   CBC with Differential/Platelet   CBC   VITAMIN D 25 Hydroxy (Vit-D Deficiency, Fractures)    Return in about 6 months (around 07/16/2024) for chronic disease f/u.   The patient was advised to call back or seek an in-person evaluation if the symptoms worsen or if the condition fails to improve as anticipated.  I discussed the assessment and treatment plan with the patient. The patient was provided an opportunity to ask questions and all were answered. The patient agreed with the plan and demonstrated an understanding of the instructions.  I, Debera Lat, PA-C have reviewed all documentation for this visit. The documentation on 01/14/2024  for the exam, diagnosis, procedures, and orders are all accurate and complete.  Debera Lat, [facility],  MMS [facility] [phone] (phone) [phone] (fax)  [facility]

## 2024-01-15 LAB — CBC WITH DIFFERENTIAL/PLATELET
Basophils Absolute: 0 10*3/uL (ref 0.0–0.2)
Basos: 1 %
EOS (ABSOLUTE): 0.1 10*3/uL (ref 0.0–0.4)
Eos: 2 %
Hematocrit: 40.1 % (ref 34.0–46.6)
Hemoglobin: 12.8 g/dL (ref 11.1–15.9)
Immature Grans (Abs): 0 10*3/uL (ref 0.0–0.1)
Immature Granulocytes: 0 %
Lymphocytes Absolute: 2.8 10*3/uL (ref 0.7–3.1)
Lymphs: 46 %
MCH: 25.5 pg — ABNORMAL LOW (ref 26.6–33.0)
MCHC: 31.9 g/dL (ref 31.5–35.7)
MCV: 80 fL (ref 79–97)
Monocytes Absolute: 0.3 10*3/uL (ref 0.1–0.9)
Monocytes: 5 %
Neutrophils Absolute: 2.7 10*3/uL (ref 1.4–7.0)
Neutrophils: 46 %
Platelets: 367 10*3/uL (ref 150–450)
RBC: 5.01 x10E6/uL (ref 3.77–5.28)
RDW: 13.1 % (ref 11.7–15.4)
WBC: 6 10*3/uL (ref 3.4–10.8)

## 2024-01-15 LAB — TSH: TSH: 3.24 u[IU]/mL (ref 0.450–4.500)

## 2024-01-15 LAB — LIPID PANEL
Chol/HDL Ratio: 3.6 ratio (ref 0.0–4.4)
Cholesterol, Total: 252 mg/dL — ABNORMAL HIGH (ref 100–199)
HDL: 70 mg/dL (ref >39)
LDL Chol Calc (NIH): 173 mg/dL — ABNORMAL HIGH (ref 0–99)
Triglycerides: 55 mg/dL (ref 0–149)
VLDL Cholesterol Cal: 9 mg/dL (ref 5–40)

## 2024-01-15 LAB — HEMOGLOBIN A1C
Est. average glucose Bld gHb Est-mCnc: 108 mg/dL
Hgb A1c MFr Bld: 5.4 % (ref 4.8–5.6)

## 2024-01-15 LAB — VITAMIN D 25 HYDROXY (VIT D DEFICIENCY, FRACTURES): Vit D, 25-Hydroxy: 42 ng/mL (ref 30.0–100.0)

## 2024-01-15 MED ORDER — TRIAMTERENE-HCTZ 75-50 MG PO TABS: 1.0000 | ORAL_TABLET | Freq: Every day | ORAL | 1 refills | Status: DC

## 2024-01-17 ENCOUNTER — Other Ambulatory Visit: Payer: Self-pay | Admitting: Physician Assistant

## 2024-01-17 DIAGNOSIS — E7849 Other hyperlipidemia: Secondary | ICD-10-CM

## 2024-01-17 MED ORDER — ROSUVASTATIN CALCIUM 5 MG PO TABS
5.0000 mg | ORAL_TABLET | Freq: Every day | ORAL | 3 refills | Status: AC
Start: 2024-01-17 — End: ?

## 2024-01-17 NOTE — Progress Notes (Signed)
 Please let patient know that she has elevated cholesterol.  Advised to start taking cholesterol medication.  If she agrees she can dispense medication as it was called in

## 2024-01-17 NOTE — Telephone Encounter (Signed)
-----   Message from Debera Lat sent at 01/17/2024  5:31 AM EDT ----- Please let patient know that she has elevated cholesterol.  Advised to start taking cholesterol medication.  If she agrees she can dispense medication as it was called in

## 2024-05-19 ENCOUNTER — Ambulatory Visit: Admitting: Physician Assistant

## 2024-05-23 NOTE — Progress Notes (Unsigned)
 Established patient visit  Patient: Amanda Ramsey   DOB: [birth year]   48 y.o. Female  MRN: [id number] Visit Date: 05/24/2024  Today's healthcare provider: Jolynn Spencer, PA-C   No chief complaint on file.  Subjective       Discussed the use of AI scribe software for clinical note transcription with the patient, who gave verbal consent to proceed.  History of Present Illness Amanda Ramsey is a 48 year old female who presents with concerns about weight management and medication use.  She experiences a plateau in weight loss despite using Wegovy  2.4 mg for three years. She recently discontinued the medication due to perceived ineffectiveness. She attempts to walk a mile daily and monitors her diet, though dietary habits are not always optimal.  Post-cholecystectomy, she experiences alternating diarrhea and hard stools. Swelling in her feet occurs with increased walking, managed by elevation and compression stockings.  Her cholesterol levels were previously elevated, and she is on a new medication for management. She denies abdominal pain, nausea, vomiting, vision problems, or swelling in other areas.      01/14/2024    2:37 PM 07/06/2023   11:03 AM 03/12/2023    1:29 PM  Depression screen PHQ 2/9  Decreased Interest 0 0 0  Down, Depressed, Hopeless 0 0 0  PHQ - 2 Score 0 0 0  Altered sleeping 1 1 1   Tired, decreased energy 0 0 0  Change in appetite 0 0 0  Feeling bad or failure about yourself  0 0 0  Trouble concentrating 0 0 0  Moving slowly or fidgety/restless 0 0 0  Suicidal thoughts 0 0 0  PHQ-9 Score 1 1 1   Difficult doing work/chores Not difficult at all  Not difficult at all      01/14/2024    2:37 PM 07/06/2023   11:03 AM  GAD 7 : Generalized Anxiety Score  Nervous, Anxious, on Edge 0 0  Control/stop worrying 1 0  Worry too much - different things 1 0  Trouble relaxing 0 0  Restless 0 0  Easily annoyed or irritable 0 0  Afraid - awful might happen 0 0   Total GAD 7 Score 2 0  Anxiety Difficulty Not difficult at all     Medications: Outpatient Medications Prior to Visit  Medication Sig   rosuvastatin  (CRESTOR ) 5 MG tablet Take 1 tablet (5 mg total) by mouth daily.   Semaglutide -Weight Management (WEGOVY ) 2.4 MG/0.75ML SOAJ ADMINISTER 2.4 MG UNDER THE SKIN 1 TIME A WEEK   telmisartan  (MICARDIS ) 80 MG tablet TAKE 1 TABLET(80 MG) BY MOUTH DAILY   triamterene -hydrochlorothiazide (MAXZIDE) 75-50 MG tablet Take 1 tablet by mouth daily.   No facility-administered medications prior to visit.    Review of Systems  All other systems reviewed and are negative.  All negative Except see HPI       Objective    There were no vitals taken for this visit.    Physical Exam Vitals reviewed.  Constitutional:      General: She is not in acute distress.    Appearance: Normal appearance. She is well-developed. She is not diaphoretic.  HENT:     Head: Normocephalic and atraumatic.  Eyes:     General: No scleral icterus.    Conjunctiva/sclera: Conjunctivae normal.  Neck:     Thyroid: No thyromegaly.  Cardiovascular:     Rate and Rhythm: Normal rate and regular rhythm.     Pulses: Normal pulses.     Heart  sounds: Normal heart sounds. No murmur heard. Pulmonary:     Effort: Pulmonary effort is normal. No respiratory distress.     Breath sounds: Normal breath sounds. No wheezing, rhonchi or rales.  Musculoskeletal:     Cervical back: Neck supple.     Right lower leg: No edema.     Left lower leg: No edema.  Lymphadenopathy:     Cervical: No cervical adenopathy.  Skin:    General: Skin is warm and dry.     Findings: No rash.  Neurological:     Mental Status: She is alert and oriented to person, place, and time. Mental status is at baseline.  Psychiatric:        Mood and Affect: Mood normal.        Behavior: Behavior normal.      No results found for any visits on 05/24/24.    Assessment & Plan Obesity/weight  management Weight plateau on Wegovy  2.4 mg with some gain. Reluctant to structured programs but open to social activities for motivation. Continuing Wegovy  crucial to prevent regain. - Continue Wegovy  2.4 mg. - Encourage increased exercise and stricter dietary control. - Consider social activities like line dancing or group exercise. - Discuss potential referral to a nutritionist. Has 4 refills left Will follow-up  Hyperlipidemia Previously elevated cholesterol, started on new medication. Weight loss can aid in reducing cholesterol, blood pressure, and blood sugar. - Order lipid panel. Continue rosuvastatin  5 Advised low-cholesterol diet and exercise  High risk medication  thyroid monitoring On Wegovy  for three years, raising thyroid concerns. No symptoms reported. Ultrasound recommended for safety. - Order thyroid ultrasound. - Check TSH levels.  Post-cholecystectomy effects Bowel movement changes post-gallbladder removal likely due to altered bile production. - Monitor bowel movement patterns and manage symptoms.  General Health Maintenance Biometric screening planned. Monitoring vision, vascular health, and kidney function important due to hypertension and hyperlipidemia. - Schedule biometric screening next month. - Monitor vision, vascular health, and kidney function.  Follow-up Advised follow-up for evaluation and management. Blood work necessary for health monitoring. - Schedule follow-up appointment in one month. - Order blood work including CBC, liver and kidney function tests.  Other hyperlipidemia (Primary) - Comprehensive metabolic panel with GFR - CBC with Differential/Platelet - Lipid panel  Essential (primary) hypertension Chronic and stable Continue Micardis  80 mg and Microzide 75-50 mg Continue low-salt diet Encourage dancing classes and daily exercise Work up  Ordered - Comprehensive metabolic panel with GFR - CBC with Differential/Platelet - Lipid  panel Will reassess after  receiving lab results  Class 2 severe obesity due to excess calories with serious comorbidity and body mass index (BMI) of 38.0 to 38.9 in adult [facility]) - Comprehensive metabolic panel with GFR - CBC with Differential/Platelet - Lipid panel - TSH - US  THYROID; Future  Goiter/high risk medication - US  THYROID; Future   No orders of the defined types were placed in this encounter.   No follow-ups on file.   The patient was advised to call back or seek an in-person evaluation if the symptoms worsen or if the condition fails to improve as anticipated.  I discussed the assessment and treatment plan with the patient. The patient was provided an opportunity to ask questions and all were answered. The patient agreed with the plan and demonstrated an understanding of the instructions.  I, Jamari Moten, PA-C have reviewed all documentation for this visit. The documentation on 05/24/2024  for the exam, diagnosis, procedures, and orders are all accurate and  complete.  Jolynn Spencer, [facility], MMS [facility] [phone] (phone) [phone] (fax)  [facility]

## 2024-05-24 ENCOUNTER — Encounter: Payer: Self-pay | Admitting: Physician Assistant

## 2024-05-24 ENCOUNTER — Ambulatory Visit: Admitting: Physician Assistant

## 2024-05-24 VITALS — BP 116/73 | HR 68 | Resp 16 | Ht 67.0 in | Wt 223.0 lb

## 2024-05-24 DIAGNOSIS — Z9049 Acquired absence of other specified parts of digestive tract: Secondary | ICD-10-CM

## 2024-05-24 DIAGNOSIS — E66812 Obesity, class 2: Secondary | ICD-10-CM

## 2024-05-24 DIAGNOSIS — Z6838 Body mass index (BMI) 38.0-38.9, adult: Secondary | ICD-10-CM

## 2024-05-24 DIAGNOSIS — E049 Nontoxic goiter, unspecified: Secondary | ICD-10-CM | POA: Diagnosis not present

## 2024-05-24 DIAGNOSIS — E7849 Other hyperlipidemia: Secondary | ICD-10-CM | POA: Diagnosis not present

## 2024-05-24 DIAGNOSIS — Z79899 Other long term (current) drug therapy: Secondary | ICD-10-CM

## 2024-05-24 DIAGNOSIS — I10 Essential (primary) hypertension: Secondary | ICD-10-CM | POA: Diagnosis not present

## 2024-05-25 ENCOUNTER — Ambulatory Visit: Payer: Self-pay | Admitting: Physician Assistant

## 2024-05-25 DIAGNOSIS — Z9049 Acquired absence of other specified parts of digestive tract: Secondary | ICD-10-CM | POA: Insufficient documentation

## 2024-05-25 DIAGNOSIS — E049 Nontoxic goiter, unspecified: Secondary | ICD-10-CM | POA: Insufficient documentation

## 2024-05-25 DIAGNOSIS — Z79899 Other long term (current) drug therapy: Secondary | ICD-10-CM | POA: Insufficient documentation

## 2024-05-25 DIAGNOSIS — E7849 Other hyperlipidemia: Secondary | ICD-10-CM | POA: Insufficient documentation

## 2024-05-25 LAB — LIPID PANEL
Chol/HDL Ratio: 2.7 ratio (ref 0.0–4.4)
Cholesterol, Total: 167 mg/dL (ref 100–199)
HDL: 63 mg/dL (ref >39)
LDL Chol Calc (NIH): 93 mg/dL (ref 0–99)
Triglycerides: 57 mg/dL (ref 0–149)
VLDL Cholesterol Cal: 11 mg/dL (ref 5–40)

## 2024-05-25 LAB — CBC WITH DIFFERENTIAL/PLATELET
Basophils Absolute: 0 x10E3/uL (ref 0.0–0.2)
Basos: 1 %
EOS (ABSOLUTE): 0.1 x10E3/uL (ref 0.0–0.4)
Eos: 2 %
Hematocrit: 41.7 % (ref 34.0–46.6)
Hemoglobin: 13.2 g/dL (ref 11.1–15.9)
Immature Grans (Abs): 0 x10E3/uL (ref 0.0–0.1)
Immature Granulocytes: 0 %
Lymphocytes Absolute: 2.3 x10E3/uL (ref 0.7–3.1)
Lymphs: 43 %
MCH: 26.3 pg — ABNORMAL LOW (ref 26.6–33.0)
MCHC: 31.7 g/dL (ref 31.5–35.7)
MCV: 83 fL (ref 79–97)
Monocytes Absolute: 0.4 x10E3/uL (ref 0.1–0.9)
Monocytes: 7 %
Neutrophils Absolute: 2.6 x10E3/uL (ref 1.4–7.0)
Neutrophils: 47 %
Platelets: 317 x10E3/uL (ref 150–450)
RBC: 5.01 x10E6/uL (ref 3.77–5.28)
RDW: 13.7 % (ref 11.7–15.4)
WBC: 5.4 x10E3/uL (ref 3.4–10.8)

## 2024-05-25 LAB — COMPREHENSIVE METABOLIC PANEL WITH GFR
ALT: 42 IU/L — ABNORMAL HIGH (ref 0–32)
AST: 26 IU/L (ref 0–40)
Albumin: 4.7 g/dL (ref 3.9–4.9)
Alkaline Phosphatase: 41 IU/L — ABNORMAL LOW (ref 44–121)
BUN/Creatinine Ratio: 18 (ref 9–23)
BUN: 20 mg/dL (ref 6–24)
Bilirubin Total: 0.6 mg/dL (ref 0.0–1.2)
CO2: 18 mmol/L — ABNORMAL LOW (ref 20–29)
Calcium: 10.2 mg/dL (ref 8.7–10.2)
Chloride: 103 mmol/L (ref 96–106)
Creatinine, Ser: 1.1 mg/dL — ABNORMAL HIGH (ref 0.57–1.00)
Globulin, Total: 3.3 g/dL (ref 1.5–4.5)
Glucose: 87 mg/dL (ref 70–99)
Potassium: 3.9 mmol/L (ref 3.5–5.2)
Sodium: 144 mmol/L (ref 134–144)
Total Protein: 8 g/dL (ref 6.0–8.5)
eGFR: 62 mL/min/1.73 (ref >59)

## 2024-05-25 LAB — TSH: TSH: 3.22 u[IU]/mL (ref 0.450–4.500)

## 2024-06-07 ENCOUNTER — Other Ambulatory Visit: Payer: Self-pay | Admitting: Physician Assistant

## 2024-06-07 DIAGNOSIS — I10 Essential (primary) hypertension: Secondary | ICD-10-CM

## 2024-06-27 ENCOUNTER — Ambulatory Visit
Admission: RE | Admit: 2024-06-27 | Discharge: 2024-06-27 | Disposition: A | Discharge status: Home / Self Care | Source: Ambulatory Visit | Attending: Physician Assistant | Admitting: Physician Assistant

## 2024-06-27 DIAGNOSIS — E66812 Obesity, class 2: Secondary | ICD-10-CM | POA: Insufficient documentation

## 2024-06-27 DIAGNOSIS — Z6838 Body mass index (BMI) 38.0-38.9, adult: Secondary | ICD-10-CM | POA: Insufficient documentation

## 2024-06-27 DIAGNOSIS — E049 Nontoxic goiter, unspecified: Secondary | ICD-10-CM

## 2024-07-06 ENCOUNTER — Other Ambulatory Visit (HOSPITAL_COMMUNITY)
Admission: RE | Admit: 2024-07-06 | Discharge: 2024-07-06 | Disposition: A | Discharge status: Home / Self Care | Source: Ambulatory Visit | Attending: Physician Assistant | Admitting: Physician Assistant

## 2024-07-06 ENCOUNTER — Encounter: Payer: Self-pay | Admitting: Physician Assistant

## 2024-07-06 ENCOUNTER — Ambulatory Visit (INDEPENDENT_AMBULATORY_CARE_PROVIDER_SITE_OTHER): Admitting: Physician Assistant

## 2024-07-06 ENCOUNTER — Other Ambulatory Visit: Payer: Self-pay | Admitting: Physician Assistant

## 2024-07-06 VITALS — BP 111/74 | HR 78 | Temp 98.1°F | Ht 67.0 in | Wt 227.3 lb

## 2024-07-06 DIAGNOSIS — I10 Essential (primary) hypertension: Secondary | ICD-10-CM

## 2024-07-06 DIAGNOSIS — R748 Abnormal levels of other serum enzymes: Secondary | ICD-10-CM

## 2024-07-06 DIAGNOSIS — Z113 Encounter for screening for infections with a predominantly sexual mode of transmission: Secondary | ICD-10-CM | POA: Diagnosis present

## 2024-07-06 DIAGNOSIS — R8290 Unspecified abnormal findings in urine: Secondary | ICD-10-CM

## 2024-07-06 DIAGNOSIS — Z91148 Patient's other noncompliance with medication regimen for other reason: Secondary | ICD-10-CM

## 2024-07-06 DIAGNOSIS — Z6838 Body mass index (BMI) 38.0-38.9, adult: Secondary | ICD-10-CM

## 2024-07-06 DIAGNOSIS — Z1211 Encounter for screening for malignant neoplasm of colon: Secondary | ICD-10-CM

## 2024-07-06 DIAGNOSIS — E049 Nontoxic goiter, unspecified: Secondary | ICD-10-CM

## 2024-07-06 DIAGNOSIS — Z0001 Encounter for general adult medical examination with abnormal findings: Secondary | ICD-10-CM | POA: Diagnosis not present

## 2024-07-06 DIAGNOSIS — G4709 Other insomnia: Secondary | ICD-10-CM

## 2024-07-06 DIAGNOSIS — E7849 Other hyperlipidemia: Secondary | ICD-10-CM

## 2024-07-06 DIAGNOSIS — E66812 Obesity, class 2: Secondary | ICD-10-CM | POA: Diagnosis not present

## 2024-07-06 DIAGNOSIS — Z0000 Encounter for general adult medical examination without abnormal findings: Secondary | ICD-10-CM

## 2024-07-06 DIAGNOSIS — Z860100 Personal history of colon polyps, unspecified: Secondary | ICD-10-CM

## 2024-07-06 LAB — POCT URINALYSIS DIPSTICK
Bilirubin, UA: NEGATIVE
Glucose, UA: NEGATIVE
Ketones, UA: NEGATIVE
Leukocytes, UA: NEGATIVE
Nitrite, UA: NEGATIVE
Odor: NORMAL
Protein, UA: NEGATIVE
Spec Grav, UA: 1.01 (ref 1.010–1.025)
Urobilinogen, UA: 0.2 U/dL
pH, UA: 5 (ref 5.0–8.0)

## 2024-07-06 NOTE — Progress Notes (Signed)
 Complete physical exam  Patient: Amanda Ramsey   DOB: 1976-09-19   48 y.o. Female  MRN: [id number] Visit Date: 07/06/2024  Today's healthcare provider: Jolynn Spencer, PA-C   Chief Complaint  Patient presents with   Annual Exam    Patient presents for annual exam  Diet: Normal  Exercise: None  Sleep: Not well, has issues falling asleep Overall feeling: Okay  Screenings: Colonoscopy- GI referral pended, would to do vaginal swab and testing for STDs today as well Vaccines: Tdap- Declines at this time   Subjective    Amanda Ramsey is a 48 y.o. female who presents today for a complete physical exam.   Discussed the use of AI scribe software for clinical note transcription with the patient, who gave verbal consent to proceed.  History of Present Illness 7/25 Amanda Ramsey is a 48 year old female who presents for a follow-up visit.  She has difficulty initiating sleep but maintains sleep once achieved. Occasional snoring and shortness of breath are noted. She lacks motivation to continue her previous routine of walking a mile daily. Her current medications include telmisartan , triamterene /hydrochlorothiazide, and rosuvastatin  for hypertension and hyperlipidemia. Her blood pressure was elevated during a recent biometric screening. 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 Previous weight loss attempts with Wegovy  and Ozempic  were discontinued due to side effects. She denies significant anxiety or depression.  Labwork was reviewed and showed elevated liver enzyme and elevated alkaline phosphatase.  The 10-year ASCVD risk score (risk attack and stroke) is: 1.3%  Last depression screening scores    07/06/2024    1:42 PM 05/24/2024    1:26 PM 01/14/2024    2:37 PM  PHQ 2/9 Scores  PHQ - 2 Score 0 0 0  PHQ- 9 Score 3 3 1    Last fall risk screening    07/06/2024    1:42 PM  Fall Risk   Falls in  the past year? 0  Number falls in past yr: 0  Injury with Fall? 0  Risk for fall due to : No Fall Risks  Follow up Falls evaluation completed   Last Audit-C alcohol use screening    04/28/2023   10:54 AM  Alcohol Use Disorder Test (AUDIT)  1. How often do you have a drink containing alcohol? 1  2. How many drinks containing alcohol do you have on a typical day when you are drinking? 0  3. How often do you have six or more drinks on one occasion? 0  AUDIT-C Score 1   A score of 3 or more in women, and 4 or more in men indicates increased risk for alcohol abuse, EXCEPT if all of the points are from question 1   Past Medical History:  Diagnosis Date   Anemia    Gallstones    Hypertension    Past Surgical History:  Procedure Laterality Date   ABDOMINAL HYSTERECTOMY  2011   CHOLECYSTECTOMY     Social History   Socioeconomic History   Marital status: Single    Spouse name: Not on file   Number of children: Not on file   Years of education: Not on file   Highest education level: Not on file  Occupational History   Not on file  Tobacco Use   Smoking status: Never   Smokeless tobacco: Never  Vaping Use   Vaping status: Never Used  Substance and Sexual Activity  Alcohol use: Yes    Alcohol/week: 0.0 standard drinks of alcohol    Comment: occasionally   Drug use: No   Sexual activity: Not Currently    Birth control/protection: Surgical  Other Topics Concern   Not on file  Social History Narrative   Not on file   Social Drivers of Health   Financial Resource Strain: Not on file  Food Insecurity: Not on file  Transportation Needs: Not on file  Physical Activity: Not on file  Stress: Not on file  Social Connections: Not on file  Intimate Partner Violence: Not on file   Family Status  Relation Name Status   Mother  Alive   Father  Deceased   Sister  Alive   Neg Hx  (Not Specified)  No partnership data on file   Family History  Problem Relation Age of Onset    Hypertension Mother    Alcohol abuse Father    Cirrhosis Father    Seizures Father    Hypertension Sister    Breast cancer Neg Hx    No Known Allergies  Patient Care Team: Crockett Rallo, PA-C as PCP - General (Physician Assistant)   Medications: Outpatient Medications Prior to Visit  Medication Sig   Multiple Vitamins-Minerals (MULTIVITAMIN WITH MINERALS) tablet Take 1 tablet by mouth daily.   rosuvastatin  (CRESTOR ) 5 MG tablet Take 1 tablet (5 mg total) by mouth daily.   telmisartan  (MICARDIS ) 80 MG tablet TAKE 1 TABLET BY MOUTH EVERY DAY   triamterene -hydrochlorothiazide (MAXZIDE) 75-50 MG tablet Take 1 tablet by mouth daily.   [DISCONTINUED] Semaglutide -Weight Management (WEGOVY ) 2.4 MG/0.75ML SOAJ ADMINISTER 2.4 MG UNDER THE SKIN 1 TIME A WEEK   No facility-administered medications prior to visit.    Review of Systems  All other systems reviewed and are negative.  Except see HPI     Objective    BP 111/74 (BP Location: Left Arm, Patient Position: Sitting, Cuff Size: Normal)   Pulse 78   Temp 98.1 F (36.7 C) (Oral)   Ht 5' 7 (1.702 m)   Wt 227 lb 4.8 oz (103.1 kg)   SpO2 100%   BMI 35.60 kg/m      Physical Exam Vitals reviewed.  Constitutional:      General: She is not in acute distress.    Appearance: Normal appearance. She is well-developed. She is not ill-appearing, toxic-appearing or diaphoretic.  HENT:     Head: Normocephalic and atraumatic.     Right Ear: Tympanic membrane, ear canal and external ear normal.     Left Ear: Tympanic membrane, ear canal and external ear normal.     Nose: Nose normal. No congestion or rhinorrhea.     Mouth/Throat:     Mouth: Mucous membranes are moist.     Pharynx: Oropharynx is clear. No oropharyngeal exudate.  Eyes:     General: No scleral icterus.       Right eye: No discharge.        Left eye: No discharge.     Conjunctiva/sclera: Conjunctivae normal.     Pupils: Pupils are equal, round, and reactive to  light.  Neck:     Thyroid : No thyromegaly.     Vascular: No carotid bruit.  Cardiovascular:     Rate and Rhythm: Normal rate and regular rhythm.     Pulses: Normal pulses.     Heart sounds: Normal heart sounds. No murmur heard.    No friction rub. No gallop.  Pulmonary:     Effort:  Pulmonary effort is normal. No respiratory distress.     Breath sounds: Normal breath sounds. No wheezing or rales.  Abdominal:     General: Abdomen is flat. Bowel sounds are normal. There is no distension.     Palpations: Abdomen is soft. There is no mass.     Tenderness: There is no abdominal tenderness. There is no right CVA tenderness, left CVA tenderness, guarding or rebound.     Hernia: No hernia is present.  Musculoskeletal:        General: No swelling, tenderness, deformity or signs of injury. Normal range of motion.     Cervical back: Normal range of motion and neck supple. No rigidity or tenderness.     Right lower leg: No edema.     Left lower leg: No edema.  Lymphadenopathy:     Cervical: No cervical adenopathy.  Skin:    General: Skin is warm and dry.     Coloration: Skin is not jaundiced or pale.     Findings: No bruising, erythema, lesion or rash.  Neurological:     Mental Status: She is alert and oriented to person, place, and time. Mental status is at baseline.     Gait: Gait normal.  Psychiatric:        Mood and Affect: Mood normal.        Behavior: Behavior normal.        Thought Content: Thought content normal.        Judgment: Judgment normal.      No results found for any visits on 07/06/24.  Assessment & Plan    Routine Health Maintenance and Physical Exam  Exercise Activities and Dietary recommendations  Goals   None     Immunization History  Administered Date(s) Administered   Moderna Sars-Covid-2 Vaccination 03/02/2020, 03/30/2020   Td 03/18/2005   Tdap 09/24/2011    Health Maintenance  Topic Date Due   Colon Cancer Screening  Never done   Flu Shot   06/09/2024   COVID-19 Vaccine (3 - Moderna risk series) 07/22/2024*   DTaP/Tdap/Td vaccine (3 - Td or Tdap) 07/06/2025*   Hepatitis B Vaccine (1 of 3 - 19+ 3-dose series) 07/06/2025*   Pap with HPV screening  01/16/2026   Hepatitis C Screening  Completed   HIV Screening  Completed   Pneumococcal Vaccine  Aged Out   HPV Vaccine  Aged Out   Meningitis B Vaccine  Aged Out  *Topic was postponed. The date shown is not the original due date.    Discussed health benefits of physical activity, and encouraged her to engage in regular exercise appropriate for her age and condition. Assessment & Plan Adult Wellness Visit Routine wellness visit with well-controlled blood pressure. Slightly elevated liver enzymes and lipids. No anxiety or depression issues. - Schedule follow-up in one year for physical examination. - Encourage regular exercise and healthy diet.  Things to do to keep yourself healthy  - Exercise at least 30-45 minutes a day, 3-4 days a week.  - Eat a low-fat diet with lots of fruits and vegetables, up to 7-9 servings per day.  - Seatbelts can save your life. Wear them always.  - Smoke detectors on every level of your home, check batteries every year.  - Eye Doctor - have an eye exam every 1-2 years  - Safe sex - if you may be exposed to STDs, use a condom.  - Alcohol -  If you drink, do it moderately, less than 2 drinks  per day.  - Health Care Power of Attorney. Choose someone to speak for you if you are not able.  - Depression is common in our stressful world.If you're feeling down or losing interest in things you normally enjoy, please come in for a visit.  - Violence - If anyone is threatening or hurting you, please call immediately.   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 Intolerance to Wegovy . Emphasized lifestyle modifications for weight management. - Encourage regular exercise and healthy diet. Will  follow-up  Hypertension Chronic Hypertension well-controlled on current medications/telmisartan  80, maxzide 75-50. Blood pressure variability possibly due to work stress. - Schedule follow-up in four months to monitor blood pressure. - Continue current antihypertensive medications. Will follow-up  Hyperlipidemia Chronic Slightly elevated lipid levels managed with rosuvastatin . Emphasized diet and exercise. - Continue rosuvastatin  5 - Encourage regular exercise and healthy diet. Will follow-up  Elevated liver enzymes Slightly elevated liver enzymes. - Encourage weight loss as a potential intervention.  Insomnia, sleep onset Difficulty with sleep onset but maintains sleep once asleep.  Medication intolerance to GLP-1 agonists Intolerance to GLP-1 agonists due to gastrointestinal side effects.  History of colonic polyps Previous colonoscopy over three years ago with large polyps. - Refer for GI consultation for colonoscopy.  Urinary symptom: abnormal odor Reported abnormal metallic odor in urine. - Perform urine dipstick test to check for abnormalities.   Colon cancer screening - Ambulatory referral to gastroenterology for colonoscopy  Screening examination for STD (sexually transmitted disease) - HIV antibody (with reflex) - Cervicovaginal ancillary only - POCT urinalysis dipstick   No follow-ups on file.    The patient was advised to call back or seek an in-person evaluation if the symptoms worsen or if the condition fails to improve as anticipated.  I discussed the assessment and treatment plan with the patient. The patient was provided an opportunity to ask questions and all were answered. The patient agreed with the plan and demonstrated an understanding of the instructions.  I, Sayyid Harewood, PA-C have reviewed all documentation for this visit. The documentation on 07/06/2024  for the exam, diagnosis, procedures, and orders are all accurate and complete.  Jolynn Spencer, [facility], MMS [facility] [phone] (phone) [phone] (fax)  [facility]

## 2024-07-07 LAB — RPR: RPR Ser Ql: NONREACTIVE

## 2024-07-07 LAB — HIV ANTIBODY (ROUTINE TESTING W REFLEX): HIV Screen 4th Generation wRfx: NONREACTIVE

## 2024-07-08 LAB — URINALYSIS, MICROSCOPIC ONLY
Casts: NONE SEEN /LPF
RBC, Urine: NONE SEEN /HPF (ref 0–2)
WBC, UA: NONE SEEN /HPF (ref 0–5)

## 2024-07-08 LAB — SPECIMEN STATUS REPORT

## 2024-07-09 LAB — URINE CULTURE

## 2024-07-09 LAB — SPECIMEN STATUS REPORT

## 2024-07-11 ENCOUNTER — Ambulatory Visit: Payer: Self-pay | Admitting: Physician Assistant

## 2024-07-11 LAB — CERVICOVAGINAL ANCILLARY ONLY
Bacterial Vaginitis (gardnerella): NEGATIVE
Candida Glabrata: NEGATIVE
Candida Vaginitis: NEGATIVE
Chlamydia: NEGATIVE
Comment: NEGATIVE
Comment: NEGATIVE
Comment: NEGATIVE
Comment: NEGATIVE
Comment: NEGATIVE
Comment: NORMAL
Neisseria Gonorrhea: NEGATIVE
Trichomonas: NEGATIVE

## 2024-07-18 ENCOUNTER — Other Ambulatory Visit: Payer: Self-pay

## 2024-07-18 ENCOUNTER — Telehealth: Payer: Self-pay

## 2024-07-18 DIAGNOSIS — Z860100 Personal history of colon polyps, unspecified: Secondary | ICD-10-CM

## 2024-07-18 MED ORDER — NA SULFATE-K SULFATE-MG SULF 17.5-3.13-1.6 GM/177ML PO SOLN: 1.0000 | Freq: Once | ORAL | 0 refills | Status: AC

## 2024-07-18 NOTE — Telephone Encounter (Signed)
 Gastroenterology Pre-Procedure Review  Request Date: 10/11/24 Requesting Physician: Dr. Marinda  PATIENT REVIEW QUESTIONS: The patient responded to the following health history questions as indicated:    1. Are you having any GI issues? no 2. Do you have a personal history of Polyps? yes (pt stated her last colonoscopy was about 3 years ago performed in [place] had colon polyps) 3. Do you have a family history of Colon Cancer or Polyps? no 4. Diabetes Mellitus? no 5. Joint replacements in the past 12 months?no 6. Major health problems in the past 3 months?no 7. Any artificial heart valves, MVP, or defibrillator?no    MEDICATIONS & ALLERGIES:    Patient reports the following regarding taking any anticoagulation/antiplatelet therapy:   Plavix, Coumadin, Eliquis, Xarelto, Lovenox, Pradaxa, Brilinta, or Effient? no Aspirin? no  Patient confirms/reports the following medications:  Current Outpatient Medications  Medication Sig Dispense Refill   Multiple Vitamins-Minerals (MULTIVITAMIN WITH MINERALS) tablet Take 1 tablet by mouth daily.     rosuvastatin  (CRESTOR ) 5 MG tablet Take 1 tablet (5 mg total) by mouth daily. 90 tablet 3   telmisartan  (MICARDIS ) 80 MG tablet TAKE 1 TABLET BY MOUTH EVERY DAY 90 tablet 0   triamterene -hydrochlorothiazide (MAXZIDE) 75-50 MG tablet Take 1 tablet by mouth daily. 90 tablet 1   No current facility-administered medications for this visit.    Patient confirms/reports the following allergies:  Allergies  Allergen Reactions   Semaglutide (0.25 Or 0.5mg -Dos) Nausea Only    No orders of the defined types were placed in this encounter.   AUTHORIZATION INFORMATION Primary Insurance: 1D#: Group #:  Secondary Insurance: 1D#: Group #:  SCHEDULE INFORMATION: Date: 10/11/24 Time: Location: [facility]

## 2024-09-06 ENCOUNTER — Encounter: Payer: Self-pay | Admitting: Physician Assistant

## 2024-09-06 ENCOUNTER — Ambulatory Visit (INDEPENDENT_AMBULATORY_CARE_PROVIDER_SITE_OTHER): Admitting: Physician Assistant

## 2024-09-06 VITALS — BP 128/82 | HR 83 | Resp 16 | Ht 67.0 in | Wt 236.7 lb

## 2024-09-06 DIAGNOSIS — R2241 Localized swelling, mass and lump, right lower limb: Secondary | ICD-10-CM | POA: Diagnosis not present

## 2024-09-06 MED ORDER — PREDNISONE 20 MG PO TABS: ORAL_TABLET | ORAL | 0 refills | Status: AC

## 2024-09-06 NOTE — Progress Notes (Signed)
 Established patient visit  Patient: Amanda Ramsey   DOB: 1975/12/18   48 y.o. Female  MRN: [id number] Visit Date: 09/06/2024  Today's healthcare provider: Jolynn Spencer, PA-C   Chief Complaint  Patient presents with   Foot Pain    R foot pain ongoing for a few years only stated same issue as before otc: ibuprofen. Pt states has seen provider for ongoing issue. Only time discussed 03/12/23. Denies of injuries   Subjective     HPI     Foot Pain    Additional comments: R foot pain ongoing for a few years only stated same issue as before otc: ibuprofen. Pt states has seen provider for ongoing issue. Only time discussed 03/12/23. Denies of injuries      Last edited by Wilfred Hargis RAMAN, CMA on 09/06/2024  1:18 PM.       Discussed the use of AI scribe software for clinical note transcription with the patient, who gave verbal consent to proceed.  History of Present Illness Amanda Ramsey is a 48 year old female who presents with foot pain and swelling.  The foot pain and swelling are warm to the touch and worsen with tight footwear, such as boots. She had to remove her boots at work due to increased swelling. The symptoms recur annually, with a similar episode in May 2024. Increased walking activity last week may have contributed to the current flare-up.  Previous evaluations, including blood work and x-rays, were negative except for small calcaneal spurs. She had elevated uric acid levels four years ago, but recent tests showed normal levels at 5.5.  Ibuprofen and foot elevation provide some relief. Sneakers with compression, such as Nikes, are uncomfortable, and she prefers slides that support her arch. No issues with sensation in her toes.       07/06/2024    1:42 PM 05/24/2024    1:26 PM 01/14/2024    2:37 PM  Depression screen PHQ 2/9  Decreased Interest 0 0 0  Down, Depressed, Hopeless 0 0 0  PHQ - 2 Score 0 0 0  Altered sleeping 3 3 1   Tired, decreased energy 0 0 0   Change in appetite 0 0 0  Feeling bad or failure about yourself  0 0 0  Trouble concentrating 0 0 0  Moving slowly or fidgety/restless 0 0 0  Suicidal thoughts 0 0 0  PHQ-9 Score 3 3 1   Difficult doing work/chores  Somewhat difficult Not difficult at all      07/06/2024    1:43 PM 05/24/2024    1:26 PM 01/14/2024    2:37 PM 07/06/2023   11:03 AM  GAD 7 : Generalized Anxiety Score  Nervous, Anxious, on Edge 0 0 0 0  Control/stop worrying 1 1 1  0  Worry too much - different things 1 1 1  0  Trouble relaxing 1 1 0 0  Restless 0 0 0 0  Easily annoyed or irritable 0 0 0 0  Afraid - awful might happen 0 0 0 0  Total GAD 7 Score 3 3 2  0  Anxiety Difficulty  Not difficult at all Not difficult at all     Medications: Outpatient Medications Prior to Visit  Medication Sig   Multiple Vitamins-Minerals (MULTIVITAMIN WITH MINERALS) tablet Take 1 tablet by mouth daily.   rosuvastatin  (CRESTOR ) 5 MG tablet Take 1 tablet (5 mg total) by mouth daily.   telmisartan  (MICARDIS ) 80 MG tablet TAKE 1 TABLET BY MOUTH EVERY DAY  triamterene -hydrochlorothiazide (MAXZIDE) 75-50 MG tablet Take 1 tablet by mouth daily.   No facility-administered medications prior to visit.    Review of Systems All negative Except see HPI       Objective    BP 128/82   Pulse 83   Resp 16   Ht 5' 7 (1.702 m)   Wt 236 lb 11.2 oz (107.4 kg)   SpO2 100%   BMI 37.07 kg/m     Physical Exam Constitutional:      General: She is not in acute distress.    Appearance: Normal appearance.  HENT:     Head: Normocephalic.  Pulmonary:     Effort: Pulmonary effort is normal. No respiratory distress.  Musculoskeletal:        General: Swelling (the dorsal aspect of the right foot) and tenderness present.  Neurological:     Mental Status: She is alert and oriented to person, place, and time. Mental status is at baseline.      No results found for any visits on 09/06/24.      Assessment & Plan Right foot pain  with swelling, possible tendinitis and calcaneal spurs Chronic right foot pain with swelling, likely exacerbated by recent increased physical activity. Previous imaging showed small calcaneal spurs. No evidence of gout or elevated uric acid levels. Symptoms interfere with daily activities. - Prescribed prednisone  for 10 days: twice daily for 5 days, then once daily for 5 days. Take with water. - Advised to avoid tight footwear and wear comfortable shoes with arch support. - Recommended foot elevation and cool compresses to reduce swelling. - Instructed to monitor symptoms; consider podiatry referral if no improvement in 7-10 days. - Discussed potential use of Celebrex if ibuprofen is ineffective or causes GI issues. - Advised to avoid pain-exacerbating activities and perform gentle stretching and massage. - Consider uric acid level check if symptoms persist. Declined labwork and XR.   No orders of the defined types were placed in this encounter.   No follow-ups on file.   The patient was advised to call back or seek an in-person evaluation if the symptoms worsen or if the condition fails to improve as anticipated.  I discussed the assessment and treatment plan with the patient. The patient was provided an opportunity to ask questions and all were answered. The patient agreed with the plan and demonstrated an understanding of the instructions.  I, Belenda Alviar, PA-C have reviewed all documentation for this visit. The documentation on 09/06/2024  for the exam, diagnosis, procedures, and orders are all accurate and complete.  Jolynn Spencer, [facility], MMS [facility] [phone] (phone) [phone] (fax)  [facility]

## 2024-09-08 ENCOUNTER — Other Ambulatory Visit: Payer: Self-pay | Admitting: Physician Assistant

## 2024-09-08 DIAGNOSIS — I10 Essential (primary) hypertension: Secondary | ICD-10-CM

## 2024-10-09 ENCOUNTER — Telehealth: Payer: Self-pay

## 2024-10-09 NOTE — Telephone Encounter (Signed)
 Per pt would like to cancel procedure. PT states she isn't due until 05-2025 Last  procedure was on 06-05-2022

## 2024-10-09 NOTE — Telephone Encounter (Signed)
 Colonoscopy has been canceled with Dr. Marinda for 10/11/24 at [facility] with Dr. Marinda.  Thanks,  [place], CMA

## 2024-10-11 ENCOUNTER — Ambulatory Visit: Admission: RE | Admit: 2024-10-11 | Source: Home / Self Care | Admitting: General Surgery

## 2024-10-11 SURGERY — COLONOSCOPY
Anesthesia: General

## 2024-11-06 ENCOUNTER — Ambulatory Visit: Admitting: Physician Assistant

## 2024-12-11 ENCOUNTER — Other Ambulatory Visit: Payer: Self-pay | Admitting: Physician Assistant

## 2024-12-11 DIAGNOSIS — I10 Essential (primary) hypertension: Secondary | ICD-10-CM

## 2024-12-27 ENCOUNTER — Ambulatory Visit: Admitting: Physician Assistant
